# Patient Record
Sex: Female | Born: 1960
Health system: Southern US, Community
[De-identification: ages and names within clinical notes are randomized; demographics above are authoritative.]

## PROBLEM LIST (undated history)

## (undated) DIAGNOSIS — Z973 Presence of spectacles and contact lenses: Secondary | ICD-10-CM

## (undated) DIAGNOSIS — F419 Anxiety disorder, unspecified: Secondary | ICD-10-CM

## (undated) DIAGNOSIS — F329 Major depressive disorder, single episode, unspecified: Secondary | ICD-10-CM

## (undated) DIAGNOSIS — I2510 Atherosclerotic heart disease of native coronary artery without angina pectoris: Secondary | ICD-10-CM

## (undated) DIAGNOSIS — E785 Hyperlipidemia, unspecified: Secondary | ICD-10-CM

## (undated) DIAGNOSIS — N951 Menopausal and female climacteric states: Secondary | ICD-10-CM

## (undated) DIAGNOSIS — R51 Headache: Secondary | ICD-10-CM

## (undated) DIAGNOSIS — K219 Gastro-esophageal reflux disease without esophagitis: Secondary | ICD-10-CM

## (undated) DIAGNOSIS — R112 Nausea with vomiting, unspecified: Secondary | ICD-10-CM

## (undated) DIAGNOSIS — M1990 Unspecified osteoarthritis, unspecified site: Secondary | ICD-10-CM

## (undated) DIAGNOSIS — E538 Deficiency of other specified B group vitamins: Secondary | ICD-10-CM

## (undated) DIAGNOSIS — F32A Depression, unspecified: Secondary | ICD-10-CM

## (undated) DIAGNOSIS — R519 Headache, unspecified: Secondary | ICD-10-CM

## (undated) DIAGNOSIS — Z9889 Other specified postprocedural states: Secondary | ICD-10-CM

## (undated) HISTORY — DX: Hyperlipidemia, unspecified: E78.5

## (undated) HISTORY — PX: TONSILLECTOMY: SUR1361

## (undated) HISTORY — DX: Atherosclerotic heart disease of native coronary artery without angina pectoris: I25.10

## (undated) HISTORY — PX: DILATION AND CURETTAGE OF UTERUS: SHX78

## (undated) HISTORY — PX: ABDOMINAL HYSTERECTOMY: SHX81

## (undated) HISTORY — DX: Deficiency of other specified B group vitamins: E53.8

## (undated) HISTORY — DX: Gastro-esophageal reflux disease without esophagitis: K21.9

## (undated) HISTORY — PX: ANKLE RECONSTRUCTION: SHX1151

## (undated) HISTORY — DX: Menopausal and female climacteric states: N95.1

## (undated) SURGERY — LEFT HEART CATH
Anesthesia: Moderate Sedation

---

## 2003-08-21 ENCOUNTER — Encounter: Admission: RE | Admit: 2003-08-21 | Discharge: 2003-08-21 | Payer: Self-pay | Admitting: Orthopaedic Surgery

## 2006-04-25 ENCOUNTER — Ambulatory Visit: Payer: Self-pay | Admitting: Family Medicine

## 2007-07-02 ENCOUNTER — Telehealth: Payer: Self-pay | Admitting: Family Medicine

## 2007-07-03 ENCOUNTER — Ambulatory Visit: Payer: Self-pay | Admitting: Family Medicine

## 2007-07-03 DIAGNOSIS — L0291 Cutaneous abscess, unspecified: Secondary | ICD-10-CM | POA: Insufficient documentation

## 2007-07-03 DIAGNOSIS — F339 Major depressive disorder, recurrent, unspecified: Secondary | ICD-10-CM | POA: Insufficient documentation

## 2007-07-03 DIAGNOSIS — L0390 Cellulitis, unspecified: Secondary | ICD-10-CM

## 2007-07-03 DIAGNOSIS — F411 Generalized anxiety disorder: Secondary | ICD-10-CM | POA: Insufficient documentation

## 2007-07-06 ENCOUNTER — Telehealth: Payer: Self-pay | Admitting: Family Medicine

## 2007-07-06 ENCOUNTER — Encounter: Payer: Self-pay | Admitting: Family Medicine

## 2007-07-07 ENCOUNTER — Encounter: Payer: Self-pay | Admitting: Family Medicine

## 2007-07-16 ENCOUNTER — Encounter: Payer: Self-pay | Admitting: Family Medicine

## 2008-03-30 ENCOUNTER — Telehealth (INDEPENDENT_AMBULATORY_CARE_PROVIDER_SITE_OTHER): Payer: Self-pay | Admitting: *Deleted

## 2008-03-30 ENCOUNTER — Ambulatory Visit: Payer: Self-pay | Admitting: Family Medicine

## 2008-03-30 DIAGNOSIS — N390 Urinary tract infection, site not specified: Secondary | ICD-10-CM | POA: Insufficient documentation

## 2008-03-30 DIAGNOSIS — J1189 Influenza due to unidentified influenza virus with other manifestations: Secondary | ICD-10-CM | POA: Insufficient documentation

## 2008-03-30 LAB — CONVERTED CEMR LAB: Glucose, Urine, Semiquant: NEGATIVE

## 2008-03-31 ENCOUNTER — Encounter: Payer: Self-pay | Admitting: Family Medicine

## 2008-04-05 ENCOUNTER — Ambulatory Visit: Payer: Self-pay | Admitting: Family Medicine

## 2008-05-02 ENCOUNTER — Telehealth: Payer: Self-pay | Admitting: Family Medicine

## 2010-06-12 NOTE — Letter (Signed)
Summary: Out of Work  [employer] at [facility]  [address] [place], [place] [address]   Phone: [phone]  Fax: [phone]    July 03, 2007   Employee:  JILLAINE WAREN    To Whom It May Concern:   For Medical reasons, please excuse the above named employee from work for the following dates:  Start:   07/03/2007  End:   2/23009 if she is feeling better  If you need additional information, please feel free to contact our office.         Sincerely,    Judith Part MD

## 2011-06-11 ENCOUNTER — Ambulatory Visit: Payer: Self-pay

## 2012-03-27 ENCOUNTER — Other Ambulatory Visit: Payer: Self-pay | Admitting: Orthopedic Surgery

## 2012-03-27 DIAGNOSIS — M25561 Pain in right knee: Secondary | ICD-10-CM

## 2012-03-30 ENCOUNTER — Ambulatory Visit
Admission: RE | Admit: 2012-03-30 | Discharge: 2012-03-30 | Disposition: A | Discharge status: Home / Self Care | Payer: 59 | Source: Ambulatory Visit | Attending: Orthopedic Surgery | Admitting: Orthopedic Surgery

## 2012-03-30 DIAGNOSIS — M25561 Pain in right knee: Secondary | ICD-10-CM

## 2014-05-13 LAB — HM PAP SMEAR

## 2015-09-05 ENCOUNTER — Encounter: Payer: Self-pay | Admitting: *Deleted

## 2015-09-05 ENCOUNTER — Observation Stay
Admission: EM | Admit: 2015-09-05 | Discharge: 2015-09-06 | Disposition: A | Discharge status: Home / Self Care | Payer: 59 | Attending: Internal Medicine | Admitting: Internal Medicine

## 2015-09-05 ENCOUNTER — Emergency Department: Payer: 59

## 2015-09-05 DIAGNOSIS — E7800 Pure hypercholesterolemia, unspecified: Secondary | ICD-10-CM | POA: Insufficient documentation

## 2015-09-05 DIAGNOSIS — Z79899 Other long term (current) drug therapy: Secondary | ICD-10-CM | POA: Insufficient documentation

## 2015-09-05 DIAGNOSIS — R9439 Abnormal result of other cardiovascular function study: Secondary | ICD-10-CM | POA: Insufficient documentation

## 2015-09-05 DIAGNOSIS — I209 Angina pectoris, unspecified: Secondary | ICD-10-CM | POA: Diagnosis not present

## 2015-09-05 DIAGNOSIS — Z79890 Hormone replacement therapy (postmenopausal): Secondary | ICD-10-CM | POA: Insufficient documentation

## 2015-09-05 DIAGNOSIS — F419 Anxiety disorder, unspecified: Secondary | ICD-10-CM | POA: Insufficient documentation

## 2015-09-05 DIAGNOSIS — R079 Chest pain, unspecified: Secondary | ICD-10-CM

## 2015-09-05 DIAGNOSIS — Z880 Allergy status to penicillin: Secondary | ICD-10-CM | POA: Insufficient documentation

## 2015-09-05 DIAGNOSIS — I214 Non-ST elevation (NSTEMI) myocardial infarction: Secondary | ICD-10-CM | POA: Diagnosis present

## 2015-09-05 DIAGNOSIS — Z800 Family history of malignant neoplasm of digestive organs: Secondary | ICD-10-CM | POA: Insufficient documentation

## 2015-09-05 DIAGNOSIS — F329 Major depressive disorder, single episode, unspecified: Secondary | ICD-10-CM | POA: Insufficient documentation

## 2015-09-05 DIAGNOSIS — I200 Unstable angina: Secondary | ICD-10-CM | POA: Diagnosis present

## 2015-09-05 DIAGNOSIS — R0602 Shortness of breath: Secondary | ICD-10-CM | POA: Insufficient documentation

## 2015-09-05 DIAGNOSIS — R0789 Other chest pain: Secondary | ICD-10-CM | POA: Diagnosis not present

## 2015-09-05 DIAGNOSIS — Z8379 Family history of other diseases of the digestive system: Secondary | ICD-10-CM | POA: Diagnosis not present

## 2015-09-05 DIAGNOSIS — R42 Dizziness and giddiness: Secondary | ICD-10-CM | POA: Diagnosis not present

## 2015-09-05 HISTORY — DX: Anxiety disorder, unspecified: F41.9

## 2015-09-05 HISTORY — DX: Major depressive disorder, single episode, unspecified: F32.9

## 2015-09-05 HISTORY — DX: Depression, unspecified: F32.A

## 2015-09-05 LAB — BASIC METABOLIC PANEL
Anion gap: 8 (ref 5–15)
BUN: 12 mg/dL (ref 6–20)
CALCIUM: 9.5 mg/dL (ref 8.9–10.3)
CO2: 24 mmol/L (ref 22–32)
Chloride: 104 mmol/L (ref 101–111)
Creatinine, Ser: 1.1 mg/dL — ABNORMAL HIGH (ref 0.44–1.00)
GFR calc Af Amer: >60 mL/min (ref >60)
GFR, EST NON AFRICAN AMERICAN: 55 mL/min — AB (ref >60)
GLUCOSE: 109 mg/dL — AB (ref 65–99)
Potassium: 3.9 mmol/L (ref 3.5–5.1)
Sodium: 136 mmol/L (ref 135–145)

## 2015-09-05 LAB — CBC
HEMATOCRIT: 50.7 % — AB (ref 35.0–47.0)
Hemoglobin: 17 g/dL — ABNORMAL HIGH (ref 12.0–16.0)
MCH: 28.2 pg (ref 26.0–34.0)
MCHC: 33.5 g/dL (ref 32.0–36.0)
MCV: 84.3 fL (ref 80.0–100.0)
Platelets: 252 10*3/uL (ref 150–440)
RBC: 6.02 MIL/uL — ABNORMAL HIGH (ref 3.80–5.20)
RDW: 13.7 % (ref 11.5–14.5)
WBC: 8.9 10*3/uL (ref 3.6–11.0)

## 2015-09-05 LAB — TROPONIN I
TROPONIN I: 0.05 ng/mL — AB (ref <0.031)
TROPONIN I: 0.08 ng/mL — AB (ref <0.031)
Troponin I: 0.08 ng/mL — ABNORMAL HIGH (ref <0.031)
Troponin I: 0.07 ng/mL — ABNORMAL HIGH (ref <0.031)

## 2015-09-05 LAB — PROTIME-INR
INR: 0.94
Prothrombin Time: 12.8 seconds (ref 11.4–15.0)

## 2015-09-05 LAB — APTT: APTT: 30 s (ref 24–36)

## 2015-09-05 LAB — HEPARIN LEVEL (UNFRACTIONATED): Heparin Unfractionated: 0.29 IU/mL — ABNORMAL LOW (ref 0.30–0.70)

## 2015-09-05 MED ORDER — VITAMIN D 1000 UNITS PO TABS
1000.0000 [IU] | ORAL_TABLET | Freq: Every day | ORAL | Status: DC
  Administered 2015-09-06: 1000 [IU] via ORAL
  Filled 2015-09-05 (×2): qty 1

## 2015-09-05 MED ORDER — NITROGLYCERIN 0.4 MG SL SUBL: 0.4000 mg | SUBLINGUAL_TABLET | SUBLINGUAL | Status: DC | PRN

## 2015-09-05 MED ORDER — ADULT MULTIVITAMIN W/MINERALS CH
1.0000 | ORAL_TABLET | Freq: Every day | ORAL | Status: DC
  Administered 2015-09-05 – 2015-09-06 (×2): 1 via ORAL
  Filled 2015-09-05 (×2): qty 1

## 2015-09-05 MED ORDER — ACETAMINOPHEN 650 MG RE SUPP: 650.0000 mg | Freq: Four times a day (QID) | RECTAL | Status: DC | PRN

## 2015-09-05 MED ORDER — HEPARIN (PORCINE) IN NACL 100-0.45 UNIT/ML-% IJ SOLN
1000.0000 [IU]/h | INTRAMUSCULAR | Status: DC
  Administered 2015-09-05: 850 [IU]/h via INTRAVENOUS
  Administered 2015-09-06: 1000 [IU]/h via INTRAVENOUS
  Filled 2015-09-05 (×4): qty 250

## 2015-09-05 MED ORDER — BUPROPION HCL 75 MG PO TABS
75.0000 mg | ORAL_TABLET | Freq: Two times a day (BID) | ORAL | Status: DC
  Administered 2015-09-05 – 2015-09-06 (×2): 75 mg via ORAL
  Filled 2015-09-05 (×2): qty 1

## 2015-09-05 MED ORDER — MORPHINE SULFATE (PF) 2 MG/ML IV SOLN: 2.0000 mg | INTRAVENOUS | Status: DC | PRN

## 2015-09-05 MED ORDER — VITAMIN C 500 MG PO TABS
500.0000 mg | ORAL_TABLET | Freq: Every day | ORAL | Status: DC
  Administered 2015-09-05 – 2015-09-06 (×2): 500 mg via ORAL
  Filled 2015-09-05 (×2): qty 1

## 2015-09-05 MED ORDER — OXYCODONE HCL 5 MG PO TABS: 5.0000 mg | ORAL_TABLET | ORAL | Status: DC | PRN

## 2015-09-05 MED ORDER — HEPARIN BOLUS VIA INFUSION
4000.0000 [IU] | Freq: Once | INTRAVENOUS | Status: AC
  Administered 2015-09-05: 4000 [IU] via INTRAVENOUS
  Filled 2015-09-05: qty 4000

## 2015-09-05 MED ORDER — HEPARIN BOLUS VIA INFUSION
1100.0000 [IU] | Freq: Once | INTRAVENOUS | Status: AC
  Administered 2015-09-05: 1100 [IU] via INTRAVENOUS
  Filled 2015-09-05: qty 1100

## 2015-09-05 MED ORDER — ACETAMINOPHEN 325 MG PO TABS
650.0000 mg | ORAL_TABLET | Freq: Four times a day (QID) | ORAL | Status: DC | PRN
  Administered 2015-09-05: 650 mg via ORAL
  Filled 2015-09-05: qty 2

## 2015-09-05 MED ORDER — ASPIRIN EC 81 MG PO TBEC
81.0000 mg | DELAYED_RELEASE_TABLET | Freq: Every day | ORAL | Status: DC
  Administered 2015-09-06: 81 mg via ORAL
  Filled 2015-09-05: qty 1

## 2015-09-05 MED ORDER — SODIUM CHLORIDE 0.9 % IV SOLN
INTRAVENOUS | Status: DC
  Administered 2015-09-05 – 2015-09-06 (×2): via INTRAVENOUS

## 2015-09-05 MED ORDER — SODIUM CHLORIDE 0.9% FLUSH: 3.0000 mL | Freq: Two times a day (BID) | INTRAVENOUS | Status: DC

## 2015-09-05 MED ORDER — NITROGLYCERIN 0.4 MG/SPRAY TL SOLN: 1.0000 | Status: DC | PRN

## 2015-09-05 MED ORDER — ESTRADIOL 0.1 MG/24HR TD PTWK
0.1000 mg | MEDICATED_PATCH | TRANSDERMAL | Status: DC
  Administered 2015-09-05: 0.1 mg via TRANSDERMAL
  Filled 2015-09-05: qty 1

## 2015-09-05 MED ORDER — PREDNISONE 2.5 MG PO TABS
15.0000 mg | ORAL_TABLET | Freq: Once | ORAL | Status: AC
  Administered 2015-09-05: 15 mg via ORAL
  Filled 2015-09-05: qty 1

## 2015-09-05 MED ORDER — NITROGLYCERIN 0.4 MG SL SUBL
0.4000 mg | SUBLINGUAL_TABLET | Freq: Once | SUBLINGUAL | Status: AC
  Administered 2015-09-05: 0.4 mg via SUBLINGUAL
  Filled 2015-09-05: qty 3

## 2015-09-05 MED ORDER — ASPIRIN 81 MG PO CHEW
324.0000 mg | CHEWABLE_TABLET | Freq: Once | ORAL | Status: AC
  Administered 2015-09-05: 324 mg via ORAL
  Filled 2015-09-05: qty 4

## 2015-09-05 NOTE — Progress Notes (Signed)
Patient admited to unit from ED for chest pain and SOB with excertion. Patient presently resting in the bed, alert and oriented. Denies pain at time. Heparin drip infusing at 8.5 unit per hr, vss, family at bedside.

## 2015-09-05 NOTE — H&P (Signed)
[facility] [place] [facility]   PATIENT NAME: Kathy Avila    MR#:  [id number]  DATE OF BIRTH:  1960-08-14  DATE OF ADMISSION:  09/05/2015  PRIMARY CARE PHYSICIAN: Lenice Llamas, NP   REQUESTING/REFERRING PHYSICIAN: Dr. Jene Every  CHIEF COMPLAINT:   Chief Complaint  Patient presents with  . Chest Pain    HISTORY OF PRESENT ILLNESS:  Kathy Avila  is a 55 y.o. female presents after chest pain. This morning she walked a mile. She usually walks a mile. 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 Then she developed chest pain in the center of her chest 10 out of 10 in intensity. It eased up to about 5 out of 6 out of 10 intensity. Pain does not radiate. Associated with shortness of breath, nausea and total body weakness. A couple weeks ago she also had similar things where she was lightheaded and seeing spots but did not develop chest pain at that time. In the ER, she was found to have a borderline troponin of 0.05 and hospitalist services were contacted for further evaluation.  PAST MEDICAL HISTORY:   Past Medical History  Diagnosis Date  . Anxiety   . Depression     PAST SURGICAL HISTORY:   Past Surgical History  Procedure Laterality Date  . Ankle reconstruction Bilateral     SOCIAL HISTORY:   Social History  Substance Use Topics  . Smoking status: Never Smoker   . Smokeless tobacco: Not on file  . Alcohol Use: 0.6 oz/week    1 Glasses of wine per week    FAMILY HISTORY:   Family History  Problem Relation Age of Onset  . Cirrhosis Mother   . Throat cancer Father     DRUG ALLERGIES:  No Known Allergies  REVIEW OF SYSTEMS:  CONSTITUTIONAL: No fever, positive for weakness positive for chills and sweats.  EYES: Some spots bilateral eyes, wears contacts EARS, NOSE, AND THROAT: No tinnitus or ear pain. No sore throat RESPIRATORY: No cough, positive for  shortness of breath, no wheezing or hemoptysis.  CARDIOVASCULAR: Positive for chest pain, no orthopnea, edema.  GASTROINTESTINAL: Positive for nausea.  No vomiting, diarrhea. Some lower abdominal pain this morning. No blood in bowel movements. GENITOURINARY: No dysuria, hematuria.  ENDOCRINE: No polyuria, nocturia,  HEMATOLOGY: No anemia, easy bruising or bleeding SKIN: No rash or lesion. MUSCULOSKELETAL: Some pain in knees.   NEUROLOGIC: No tingling, numbness, weakness.  PSYCHIATRY: History of anxiety and depression.   MEDICATIONS AT HOME:   Prior to Admission medications   Medication Sig Start Date End Date Taking? Authorizing Provider  buPROPion (WELLBUTRIN) 75 MG tablet Take 75 mg by mouth 2 (two) times daily.   Yes Historical Provider, MD  cholecalciferol (VITAMIN D) 1000 units tablet Take 1,000 Units by mouth daily.   Yes Historical Provider, MD  cyanocobalamin (,VITAMIN B-12,) 1000 MCG/ML injection Inject 1,000 mcg into the muscle once a week.   Yes Historical Provider, MD  estradiol (VIVELLE-DOT) 0.075 MG/24HR Place 1 patch onto the skin once a week.   Yes Historical Provider, MD  Multiple Vitamin (MULTIVITAMIN WITH MINERALS) TABS tablet Take 1 tablet by mouth daily.   Yes Historical Provider, MD  phytonadione (VITAMIN K) 5 MG tablet Take 5 mg by mouth daily.   Yes Historical Provider, MD  vitamin C (ASCORBIC ACID) 500 MG tablet Take 500 mg by mouth daily.   Yes Historical Provider,  MD      VITAL SIGNS:  Blood pressure 133/87, pulse 86, temperature 98.2 F (36.8 C), temperature source Oral, resp. rate 18, height 5\' 6"  (1.676 m), weight 72.576 kg (160 lb), SpO2 99 %.  PHYSICAL EXAMINATION:  GENERAL:  55 y.o.-year-old patient lying in the bed with no acute distress.  EYES: Pupils equal, round, reactive to light and accommodation. No scleral icterus. Extraocular muscles intact.  HEENT: Head atraumatic, normocephalic. Oropharynx and nasopharynx clear.  NECK:  Supple, no jugular  venous distention. No thyroid enlargement, no tenderness.  LUNGS: Normal breath sounds bilaterally, no wheezing, rales,rhonchi or crepitation. No use of accessory muscles of respiration.  CARDIOVASCULAR: S1, S2 normal. No murmurs, rubs, or gallops. Chest wall pain reproducible to palpation left parasternal area ABDOMEN: Soft, nontender, nondistended. Bowel sounds present. No organomegaly or mass.  EXTREMITIES: No pedal edema, cyanosis, or clubbing.  NEUROLOGIC: Cranial nerves II through XII are intact. Muscle strength 5/5 in all extremities. Sensation intact. Gait not checked.  PSYCHIATRIC: The patient is alert and oriented x 3.  SKIN: No rash, lesion, or ulcer.   LABORATORY PANEL:   CBC  Recent Labs Lab 09/05/15 0905  WBC 8.9  HGB 17.0*  HCT 50.7*  PLT 252   ------------------------------------------------------------------------------------------------------------------  Chemistries   Recent Labs Lab 09/05/15 0905  NA 136  K 3.9  CL 104  CO2 24  GLUCOSE 109*  BUN 12  CREATININE 1.10*  CALCIUM 9.5   ------------------------------------------------------------------------------------------------------------------  Cardiac Enzymes  Recent Labs Lab 09/05/15 0905  TROPONINI 0.05*   ------------------------------------------------------------------------------------------------------------------  RADIOLOGY:  Dg Chest 2 View  09/05/2015  CLINICAL DATA:  Left chest pain and shortness of breath. EXAM: CHEST  2 VIEW COMPARISON:  None. FINDINGS: The heart size and mediastinal contours are within normal limits. There is no focal infiltrate, pulmonary edema, or pleural effusion. Degenerative joint changes of the spine are identified. IMPRESSION: No active cardiopulmonary disease. Electronically Signed   By: Sherian ReinWei-Chen  Lin M.D.   On: 09/05/2015 09:42    EKG:   Sinus rhythm 93 bpm poor R-wave progression  IMPRESSION AND PLAN:   1. Chest pain at rest and borderline  troponin, concerning for possible unstable angina. Patient does have a reproducible chest pain component. Aspirin given and heparin drip ordered. ER physician spoke with cardiologist Dr. Gwen PoundsKowalski. Serial troponins and monitor on telemetry. Since the patient does have a reproducible component oh give 1 dose of prednisone. Nitroglycerin as needed. 2. Anxiety depression continue Wellbutrin 3. Continue hormone replacement for right now 4. Elevated hemoglobin. No priors to look at. I will hydrate overnight and recheck tomorrow.   All the records are reviewed and case discussed with ED provider. Management plans discussed with the patient, family and they are in agreement.  CODE STATUS: Full code  TOTAL TIME TAKING CARE OF THIS PATIENT: 50 minutes.    Alford HighlandWIETING, Linsey Arteaga M.D on 09/05/2015 at 11:18 AM  Between 7am to 6pm - Pager - [phone]  After 6pm call admission pager [phone]  [facility]  [phone]  CC: Primary care physician; Lenice LlamasBROWN, LESLIE A, NP

## 2015-09-05 NOTE — Consult Note (Signed)
[facility] Cardiology Consultation Note  Patient ID: Kathy Avila, MRN: [id number], DOB/AGE: 57/21/1962 57 y.o. Admit date: 09/05/2015   Date of Consult: 09/05/2015 Primary Physician: Lenice Llamas, NP Primary Cardiologist: None  Chief Complaint:  Chief Complaint  Patient presents with  . Chest Pain   Reason for Consult: chest pain  HPI: 55 y.o. female with no history of cardiovascular disease and no family history of cardiovascular disease with no evidence of history of hypertension hyperlipidemia diabetes or other cardiovascular issues having had new onset substernal chest discomfort radiating into her back significant to a concerns causing dizziness weakness as well as shortness of breath for several hours after resting. The patient has done a physical activity regimen of walking recently and has had no symptoms whatsoever. With this chest discomfort she was seen in the emergency room with an EKG showing normal sinus rhythm and a troponin of 0.08. The patient has not had any other changes and he she has had a full relief of her chest pain at this time.  Past Medical History  Diagnosis Date  . Anxiety   . Depression       Surgical History:  Past Surgical History  Procedure Laterality Date  . Ankle reconstruction Bilateral      Home Meds: Prior to Admission medications   Medication Sig Start Date End Date Taking? Authorizing Provider  buPROPion (WELLBUTRIN) 75 MG tablet Take 75 mg by mouth 2 (two) times daily.   Yes Historical Provider, MD  cholecalciferol (VITAMIN D) 1000 units tablet Take 1,000 Units by mouth daily.   Yes Historical Provider, MD  cyanocobalamin (,VITAMIN B-12,) 1000 MCG/ML injection Inject 1,000 mcg into the muscle once a week.   Yes Historical Provider, MD  estradiol (VIVELLE-DOT) 0.075 MG/24HR Place 1 patch onto the skin once a week.   Yes Historical Provider, MD  Multiple Vitamin (MULTIVITAMIN WITH MINERALS) TABS tablet Take 1 tablet by mouth  daily.   Yes Historical Provider, MD  phytonadione (VITAMIN K) 5 MG tablet Take 5 mg by mouth daily.   Yes Historical Provider, MD  vitamin C (ASCORBIC ACID) 500 MG tablet Take 500 mg by mouth daily.   Yes Historical Provider, MD    Inpatient Medications:  . [START ON 09/06/2015] aspirin EC  81 mg Oral Daily  . buPROPion  75 mg Oral BID  . [START ON 09/06/2015] cholecalciferol  1,000 Units Oral Daily  . estradiol  0.1 mg Transdermal Weekly  . multivitamin with minerals  1 tablet Oral Daily  . sodium chloride flush  3 mL Intravenous Q12H  . vitamin C  500 mg Oral Daily   . sodium chloride 50 mL/hr at 09/05/15 1429  . heparin 850 Units/hr (09/05/15 1054)    Allergies: No Known Allergies  Social History   Social History  . Marital Status: Divorced    Spouse Name: N/A  . Number of Children: N/A  . Years of Education: N/A   Occupational History  . Not on file.   Social History Main Topics  . Smoking status: Never Smoker   . Smokeless tobacco: Not on file  . Alcohol Use: 0.6 oz/week    1 Glasses of wine per week  . Drug Use: No  . Sexual Activity: Not on file   Other Topics Concern  . Not on file   Social History Narrative  . No narrative on file     Family History  Problem Relation Age of Onset  . Cirrhosis Mother   .  Throat cancer Father      Review of Systems Positive for Chest pain dizziness Negative for: General:  chills, fever, night sweats or weight changes.  Cardiovascular: PND orthopnea syncope positive for dizziness  Dermatological skin lesions rashes Respiratory: Cough congestion Urologic: Frequent urination urination at night and hematuria Abdominal: negative for nausea, vomiting, diarrhea, bright red blood per rectum, melena, or hematemesis Neurologic: negative for visual changes, and/or hearing changes  All other systems reviewed and are otherwise negative except as noted above.  Labs:  Recent Labs  09/05/15 0905 09/05/15 1324  TROPONINI  0.05* 0.08*   Lab Results  Component Value Date   WBC 8.9 09/05/2015   HGB 17.0* 09/05/2015   HCT 50.7* 09/05/2015   MCV 84.3 09/05/2015   PLT 252 09/05/2015    Recent Labs Lab 09/05/15 0905  NA 136  K 3.9  CL 104  CO2 24  BUN 12  CREATININE 1.10*  CALCIUM 9.5  GLUCOSE 109*   No results found for: CHOL, HDL, LDLCALC, TRIG No results found for: DDIMER  Radiology/Studies:  Dg Chest 2 View  09/05/2015  CLINICAL DATA:  Left chest pain and shortness of breath. EXAM: CHEST  2 VIEW COMPARISON:  None. FINDINGS: The heart size and mediastinal contours are within normal limits. There is no focal infiltrate, pulmonary edema, or pleural effusion. Degenerative joint changes of the spine are identified. IMPRESSION: No active cardiopulmonary disease. Electronically Signed   By: Sherian ReinWei-Chen  Lin M.D.   On: 09/05/2015 09:42    EKG: Normal sinus rhythm  Weights: Filed Weights   09/05/15 0902  Weight: 160 lb (72.576 kg)     Physical Exam: Blood pressure 132/83, pulse 75, temperature 98 F (36.7 C), temperature source Oral, resp. rate 18, height 5\' 6"  (1.676 m), weight 160 lb (72.576 kg), SpO2 100 %. Body mass index is 25.84 kg/(m^2). General: Well developed, well nourished, in no acute distress. Head eyes ears nose throat: Normocephalic, atraumatic, sclera non-icteric, no xanthomas, nares are without discharge. No apparent thyromegaly and/or mass  Lungs: Normal respiratory effort.  no wheezes, no rales, no rhonchi.  Heart: RRR with normal S1 S2. no murmur gallop, no rub, PMI is normal size and placement, carotid upstroke normal without bruit, jugular venous pressure is normal Abdomen: Soft, non-tender, non-distended with normoactive bowel sounds. No hepatomegaly. No rebound/guarding. No obvious abdominal masses. Abdominal aorta is normal size without bruit Extremities: No edema. no cyanosis, no clubbing, no ulcers  Peripheral : 2+ bilateral upper extremity pulses, 2+ bilateral femoral  pulses, 2+ bilateral dorsal pedal pulse Neuro: Alert and oriented. No facial asymmetry. No focal deficit. Moves all extremities spontaneously. Musculoskeletal: Normal muscle tone without kyphosis Psych:  Responds to questions appropriately with a normal affect.    Assessment 55 year old female with no evidence of cardiovascular risk factors having new onset of substernal chest discomfort with normal EKG and minimal elevation of troponin and atypical in nature without current evidence of myocardial infarction consistent with [place] class for unstable angina  Plan: 1. Heparin for further risk reduction of possible myocardial infarction 2. Serial ECG and enzymes to assess for possible myocardial infarction 3. Further consideration of cardiac catheterization versus ambulation with discharge versus stress test depending on EKG and troponin levels and clinical assessment of chest pain overnight  Signed, Lamar BlinksKOWALSKI,BRUCE J M.D. [facility] 09/05/2015, 5:19 PM

## 2015-09-05 NOTE — ED Notes (Signed)
Admit Md at bedside

## 2015-09-05 NOTE — ED Provider Notes (Signed)
[facility] Emergency Department Provider Note  ____________________________________________    I have reviewed the triage vital signs and the nursing notes.   HISTORY  Chief Complaint Chest Pain    HPI Kathy Avila is a 55 y.o. female who presents with chest pain and dizziness. Patient reports she was at work sitting at her desk when she developed pressure-like moderate central chest pain and became very dizzy and lightheaded. She has never had this before. She denies recent travel or calf pain or swelling. She does not smoke. No history of heart disease. She reports she is feeling better now but continues to have mild tightness in her chest     History reviewed. No pertinent past medical history.  Patient Active Problem List   Diagnosis Date Noted  . INFLUENZA WITH OTHER MANIFESTATIONS 03/30/2008  . UTI 03/30/2008  . ANXIETY 07/03/2007  . DEPRESSION 07/03/2007  . ABSCESS, SKIN 07/03/2007    No past surgical history on file.  No current outpatient prescriptions on file.  Allergies Penicillins  History reviewed. No pertinent family history.  Social History Social History  Substance Use Topics  . Smoking status: None  . Smokeless tobacco: None  . Alcohol Use: None    Review of Systems  Constitutional: Positive for dizziness Eyes: Negative for redness ENT: Negative for sore throat Cardiovascular: As above Respiratory: Negative for shortness of breath. Gastrointestinal: Positive for nausea Genitourinary: Negative for dysuria. Musculoskeletal: Negative for back pain. Skin: Negative for rash. Neurological: Negative for focal weakness Psychiatric: Mild anxiety    ____________________________________________   PHYSICAL EXAM:  VITAL SIGNS: ED Triage Vitals  Enc Vitals Group     BP 09/05/15 0902 138/76 mmHg     Pulse Rate 09/05/15 0902 102     Resp 09/05/15 0902 18     Temp 09/05/15 0902 98.2 F (36.8 C)     Temp  Source 09/05/15 0902 Oral     SpO2 09/05/15 0902 100 %     Weight 09/05/15 0902 160 lb (72.576 kg)     Height 09/05/15 0902 5\' 6"  (1.676 m)     Head Cir --      Peak Flow --      Pain Score 09/05/15 0903 5     Pain Loc --      Pain Edu? --      Excl. in GC? --      Constitutional: Alert and oriented. No acute distress Eyes: Conjunctivae are normal. No erythema or injection ENT   Head: Normocephalic and atraumatic.   Mouth/Throat: Mucous membranes are moist. Cardiovascular: Normal rate, regular rhythm. Normal and symmetric distal pulses are present in the upper extremities. No murmurs or rubs  Respiratory: Normal respiratory effort without tachypnea nor retractions. Breath sounds are clear and equal bilaterally.  Gastrointestinal: Soft and non-tender in all quadrants. No distention. There is no CVA tenderness. Genitourinary: deferred Musculoskeletal: Nontender with normal range of motion in all extremities. No lower extremity tenderness nor edema. Neurologic:  Normal speech and language. No gross focal neurologic deficits are appreciated. Skin:  Skin is warm, dry and intact. No rash noted. Psychiatric: Mood and affect are normal. Patient exhibits appropriate insight and judgment.  ____________________________________________    LABS (pertinent positives/negatives)  Labs Reviewed  BASIC METABOLIC PANEL - Abnormal; Notable for the following:    Glucose, Bld 109 (*)    Creatinine, Ser 1.10 (*)    GFR calc non Af Amer 55 (*)    All other components  within normal limits  CBC - Abnormal; Notable for the following:    RBC 6.02 (*)    Hemoglobin 17.0 (*)    HCT 50.7 (*)    All other components within normal limits  TROPONIN I - Abnormal; Notable for the following:    Troponin I 0.05 (*)    All other components within normal limits  APTT    ____________________________________________   EKG  #1 ED ECG REPORT I, Jene Every, the attending physician, personally  viewed and interpreted this ECG.  Date: 09/05/2015 EKG Time: 8:59 AM Rate: 95 Rhythm: normal sinus rhythm QRS Axis: normal Intervals: normal ST/T Wave abnormalities: Nonspecific T-wave change in V2 Conduction Disturbances: none I asked for a repeat based on V2 changes  #2 ED ECG REPORT I, Jene Every, the attending physician, personally viewed and interpreted this ECG.  Date: 09/05/2015 EKG Time: 9:15 AM Rate: 93 Rhythm: normal sinus rhythm QRS Axis: normal Intervals: normal ST/T Wave abnormalities: normal Conduction Disturbances: none Narrative Interpretation: unremarkable    ____________________________________________    RADIOLOGY  Chest x-ray unremarkable  ____________________________________________   PROCEDURES  Procedure(s) performed: none  Critical Care performed: yes CRITICAL CARE Performed by: Jene Every   Total critical care time: 30 minutes  Critical care time was exclusive of separately billable procedures and treating other patients.  Critical care was necessary to treat or prevent imminent or life-threatening deterioration.  Critical care was time spent personally by me on the following activities: development of treatment plan with patient and/or surrogate as well as nursing, discussions with consultants, evaluation of patient's response to treatment, examination of patient, obtaining history from patient or surrogate, ordering and performing treatments and interventions, ordering and review of laboratory studies, ordering and review of radiographic studies, pulse oximetry and re-evaluation of patient's condition.   ____________________________________________   INITIAL IMPRESSION / ASSESSMENT AND PLAN / ED COURSE  Pertinent labs & imaging results that were available during my care of the patient were reviewed by me and considered in my medical decision making (see chart for details).  Patient presents after episode of chest pain  with dizziness and nausea. Nonspecific change in V2 an initial EKG which resolved on second EKG. Patient is to have mild chest tightness. We will give aspirin, nitroglycerin.  Mildly Elevated troponin is concerning for ACS given patient's story. Given continued mild chest pain history of present illness consistent with ACS and elevated troponin I will start her on heparin drip.  Discussed the case with Dr. Gwen Pounds who agrees with management and will see the patient in the hospital  ____________________________________________   FINAL CLINICAL IMPRESSION(S) / ED DIAGNOSES  Final diagnoses:  NSTEMI (non-ST elevated myocardial infarction) (HCC)          Jene Every, MD 09/05/15 1023

## 2015-09-05 NOTE — Progress Notes (Signed)
ANTICOAGULATION CONSULT NOTE - Initial Consult  Pharmacy Consult for Heparin Indication: chest pain/ACS  No Known Allergies  Patient Measurements: Height: 5\' 6"  (167.6 cm) Weight: 160 lb (72.576 kg) IBW/kg (Calculated) : 59.3 Heparin Dosing Weight: 72.6 kg  Vital Signs: Temp: 97.9 F (36.6 C) (04/25 1918) Temp Source: Oral (04/25 1918) BP: 122/68 mmHg (04/25 1918) Pulse Rate: 88 (04/25 1918)  Labs:  Recent Labs  09/05/15 0905 09/05/15 0955 09/05/15 1324 09/05/15 1700  HGB 17.0*  --   --   --   HCT 50.7*  --   --   --   PLT 252  --   --   --   APTT  --  30  --   --   LABPROT  --  12.8  --   --   INR  --  0.94  --   --   HEPARINUNFRC  --   --   --  0.29*  CREATININE 1.10*  --   --   --   TROPONINI 0.05*  --  0.08* 0.08*    Estimated Creatinine Clearance: 57.5 mL/min (by C-G formula based on Cr of 1.1).    Assessment: 55 yo female here with chest pain, r/o ACS. No PTA meds entered yet, no anticoagulants noted in Care Everywhere, no anticoagulants per RN.  Hgb 17.0, Plt 252 INR and aPTT pending  Goal of Therapy:  Heparin level 0.3-0.7 units/ml Monitor platelets by anticoagulation protocol: Yes   Plan:  Heparin level is below goal. Will bolus heparin 1100 units and increase infusion to 1000 units/hr. Will recheck a HL in 6 hours.  Luisa HartChristy, Jamilla Galli D 09/05/2015,7:52 PM

## 2015-09-05 NOTE — ED Notes (Signed)
States at 0730 this AM she was sitting at her desk and had a sudden onset of dizziness and seeing spots with chest tightness, at present pt returns to baseline, awake and alert, states some SOB with episode

## 2015-09-05 NOTE — ED Notes (Signed)
Report given to Madelaine BhatAdam and Jeannett SeniorStephen

## 2015-09-05 NOTE — Progress Notes (Signed)
ANTICOAGULATION CONSULT NOTE - Initial Consult  Pharmacy Consult for Heparin Indication: chest pain/ACS  Allergies  Allergen Reactions  . Penicillins     Patient Measurements: Height: 5\' 6"  (167.6 cm) Weight: 160 lb (72.576 kg) IBW/kg (Calculated) : 59.3 Heparin Dosing Weight: 72.6 kg  Vital Signs: Temp: 98.2 F (36.8 C) (04/25 0902) Temp Source: Oral (04/25 0902) BP: 138/76 mmHg (04/25 0902) Pulse Rate: 102 (04/25 0902)  Labs:  Recent Labs  09/05/15 0905  HGB 17.0*  HCT 50.7*  PLT 252  CREATININE 1.10*  TROPONINI 0.05*    Estimated Creatinine Clearance: 57.5 mL/min (by C-G formula based on Cr of 1.1).    Assessment: 55 yo female here with chest pain, r/o ACS. No PTA meds entered yet, no anticoagulants noted in Care Everywhere, no anticoagulants per RN.  Hgb 17.0, Plt 252 INR and aPTT pending  Goal of Therapy:  Heparin level 0.3-0.7 units/ml Monitor platelets by anticoagulation protocol: Yes   Plan:  Give 4000 units bolus x 1 Start heparin infusion at 850 units/hr Check anti-Xa level in 6 hours and daily while on heparin Continue to monitor H&H and platelets - CBC in AM  Crist FatWang, Javarian Jakubiak L 09/05/2015,10:08 AM

## 2015-09-06 ENCOUNTER — Observation Stay: Payer: 59

## 2015-09-06 ENCOUNTER — Observation Stay
Admit: 2015-09-06 | Discharge: 2015-09-06 | Disposition: A | Discharge status: Home / Self Care | Payer: 59 | Attending: Internal Medicine | Admitting: Internal Medicine

## 2015-09-06 ENCOUNTER — Encounter
Admission: EM | Disposition: A | Discharge status: Home / Self Care | Payer: Self-pay | Source: Home / Self Care | Attending: Emergency Medicine

## 2015-09-06 HISTORY — PX: CARDIAC CATHETERIZATION: SHX172

## 2015-09-06 LAB — CBC
HCT: 46.2 % (ref 35.0–47.0)
Hemoglobin: 15.3 g/dL (ref 12.0–16.0)
MCH: 28 pg (ref 26.0–34.0)
MCHC: 33.2 g/dL (ref 32.0–36.0)
MCV: 84.5 fL (ref 80.0–100.0)
PLATELETS: 223 10*3/uL (ref 150–440)
RBC: 5.46 MIL/uL — AB (ref 3.80–5.20)
RDW: 13.5 % (ref 11.5–14.5)
WBC: 11.6 10*3/uL — AB (ref 3.6–11.0)

## 2015-09-06 LAB — HEPARIN LEVEL (UNFRACTIONATED)
HEPARIN UNFRACTIONATED: 0.68 [IU]/mL (ref 0.30–0.70)
Heparin Unfractionated: 0.7 IU/mL (ref 0.30–0.70)

## 2015-09-06 LAB — BASIC METABOLIC PANEL
ANION GAP: 5 (ref 5–15)
BUN: 13 mg/dL (ref 6–20)
CALCIUM: 8.7 mg/dL — AB (ref 8.9–10.3)
CO2: 25 mmol/L (ref 22–32)
Chloride: 108 mmol/L (ref 101–111)
Creatinine, Ser: 0.91 mg/dL (ref 0.44–1.00)
GFR calc non Af Amer: >60 mL/min (ref >60)
Glucose, Bld: 110 mg/dL — ABNORMAL HIGH (ref 65–99)
POTASSIUM: 4 mmol/L (ref 3.5–5.1)
SODIUM: 138 mmol/L (ref 135–145)

## 2015-09-06 LAB — LIPID PANEL
CHOL/HDL RATIO: 2.3 ratio
Cholesterol: 143 mg/dL (ref 0–200)
HDL: 62 mg/dL (ref >40)
LDL CALC: 64 mg/dL (ref 0–99)
TRIGLYCERIDES: 87 mg/dL (ref <150)
VLDL: 17 mg/dL (ref 0–40)

## 2015-09-06 SURGERY — LEFT HEART CATH AND CORONARY ANGIOGRAPHY
Anesthesia: Moderate Sedation

## 2015-09-06 MED ORDER — ACETAMINOPHEN 325 MG PO TABS: 650.0000 mg | ORAL_TABLET | ORAL | Status: DC | PRN

## 2015-09-06 MED ORDER — FENTANYL CITRATE (PF) 100 MCG/2ML IJ SOLN
INTRAMUSCULAR | Status: DC | PRN
  Administered 2015-09-06: 25 ug via INTRAVENOUS

## 2015-09-06 MED ORDER — SODIUM CHLORIDE 0.9 % IV SOLN: INTRAVENOUS | Status: DC

## 2015-09-06 MED ORDER — PANTOPRAZOLE SODIUM 40 MG PO TBEC: 40.0000 mg | DELAYED_RELEASE_TABLET | Freq: Every day | ORAL | Status: DC

## 2015-09-06 MED ORDER — HEPARIN (PORCINE) IN NACL 2-0.9 UNIT/ML-% IJ SOLN
INTRAMUSCULAR | Status: AC
  Filled 2015-09-06: qty 500

## 2015-09-06 MED ORDER — IOPAMIDOL (ISOVUE-370) INJECTION 76%
75.0000 mL | Freq: Once | INTRAVENOUS | Status: AC | PRN
  Administered 2015-09-06: 75 mL via INTRAVENOUS

## 2015-09-06 MED ORDER — SODIUM CHLORIDE 0.9% FLUSH: 3.0000 mL | INTRAVENOUS | Status: DC | PRN

## 2015-09-06 MED ORDER — SODIUM CHLORIDE 0.9% FLUSH: 3.0000 mL | Freq: Two times a day (BID) | INTRAVENOUS | Status: DC

## 2015-09-06 MED ORDER — ACETAMINOPHEN 650 MG RE SUPP: 650.0000 mg | Freq: Four times a day (QID) | RECTAL | Status: DC | PRN

## 2015-09-06 MED ORDER — SODIUM CHLORIDE 0.9 % WEIGHT BASED INFUSION: 3.0000 mL/kg/h | INTRAVENOUS | Status: DC

## 2015-09-06 MED ORDER — FENTANYL CITRATE (PF) 100 MCG/2ML IJ SOLN
INTRAMUSCULAR | Status: AC
  Filled 2015-09-06: qty 2

## 2015-09-06 MED ORDER — MIDAZOLAM HCL 2 MG/2ML IJ SOLN
INTRAMUSCULAR | Status: AC
  Filled 2015-09-06: qty 2

## 2015-09-06 MED ORDER — SODIUM CHLORIDE 0.9 % IV SOLN: 250.0000 mL | INTRAVENOUS | Status: DC | PRN

## 2015-09-06 MED ORDER — SODIUM CHLORIDE 0.9 % WEIGHT BASED INFUSION: 1.0000 mL/kg/h | INTRAVENOUS | Status: DC

## 2015-09-06 MED ORDER — IBUPROFEN 200 MG PO TABS
200.0000 mg | ORAL_TABLET | Freq: Four times a day (QID) | ORAL | Status: DC | PRN
Start: 2015-09-06 — End: 2017-12-26

## 2015-09-06 MED ORDER — MIDAZOLAM HCL 2 MG/2ML IJ SOLN
INTRAMUSCULAR | Status: DC | PRN
  Administered 2015-09-06 (×2): 1 mg via INTRAVENOUS

## 2015-09-06 MED ORDER — ACETAMINOPHEN 325 MG PO TABS: 650.0000 mg | ORAL_TABLET | Freq: Four times a day (QID) | ORAL | Status: DC | PRN

## 2015-09-06 MED ORDER — ONDANSETRON HCL 4 MG/2ML IJ SOLN: 4.0000 mg | Freq: Four times a day (QID) | INTRAMUSCULAR | Status: DC | PRN

## 2015-09-06 MED ORDER — PANTOPRAZOLE SODIUM 40 MG PO TBEC
40.0000 mg | DELAYED_RELEASE_TABLET | Freq: Every day | ORAL | Status: DC
  Filled 2015-09-06 (×2): qty 1

## 2015-09-06 MED ORDER — ASPIRIN 81 MG PO CHEW: 81.0000 mg | CHEWABLE_TABLET | ORAL | Status: DC

## 2015-09-06 SURGICAL SUPPLY — 9 items
CATH INFINITI 5FR ANG PIGTAIL (CATHETERS) ×2 IMPLANT
CATH INFINITI 5FR JL4 (CATHETERS) ×2 IMPLANT
CATH INFINITI JR4 5F (CATHETERS) ×2 IMPLANT
DEVICE CLOSURE MYNXGRIP 5F (Vascular Products) ×2 IMPLANT
KIT MANI 3VAL PERCEP (MISCELLANEOUS) ×2 IMPLANT
NEEDLE PERC 18GX7CM (NEEDLE) ×2 IMPLANT
PACK CARDIAC CATH (CUSTOM PROCEDURE TRAY) ×2 IMPLANT
SHEATH PINNACLE 5F 10CM (SHEATH) ×2 IMPLANT
WIRE EMERALD 3MM-J .035X150CM (WIRE) ×2 IMPLANT

## 2015-09-06 NOTE — Progress Notes (Signed)
[name]  Patient: Kathy Avila / Admit Date: 09/05/2015 / Date of Encounter: 09/06/2015, 12:39 PM   Subjecti Patient had continued chest discomfort with changes in position in the bed with a burning concern Patient attempted treadmill EKG with no EKG changes but progressive symptoms of unstable angina RDI catheterization performed showing normal LV systolic function and normal coronary arteries with ejection fraction of 60%  Review of Systems: Positive for: Chest pain Negative for: Vision change, hearing change, syncope, dizziness, nausea, vomiting,diarrhea, bloody stool, stomach pain, cough, congestion, diaphoresis, urinary frequency, urinary pain,skin lesions, skin rashes Others previously listed  Objective: Telemetry: Normal sinus rhythm Physical Exam: Blood pressure 120/69, pulse 81, temperature 98.1 F (36.7 C), temperature source Oral, resp. rate 14, height 5\' 6"  (1.676 m), weight 160 lb (72.576 kg), SpO2 100 %. Body mass index is 25.84 kg/(m^2). General: Well developed, well nourished, in no acute distress. Head: Normocephalic, atraumatic, sclera non-icteric, no xanthomas, nares are without discharge. Neck: No apparent masses Lungs: Normal respirations with no wheezes, no rhonchi, no rales , no crackles   Heart: Regular rate and rhythm, normal S1 S2, no murmur, no rub, no gallop, PMI is normal size and placement, carotid upstroke normal without bruit, jugular venous pressure normal Abdomen: Soft, non-tender, non-distended with normoactive bowel sounds. No hepatosplenomegaly. Abdominal aorta is normal size without bruit Extremities: No edema, no clubbing, no cyanosis, no ulcers,  Peripheral: 2+ radial, 2+ femoral, 2+ dorsal pedal pulses Neuro: Alert and oriented. Moves all extremities spontaneously. Psych:  Responds to questions appropriately with a normal affect.   Intake/Output Summary (Last 24 hours) at 09/06/15 1239 Last data  filed at 09/06/15 0830  Gross per 24 hour  Intake 312.23 ml  Output   1900 ml  Net -1587.77 ml    Inpatient Medications:  . [MAR Hold] aspirin EC  81 mg Oral Daily  . [MAR Hold] buPROPion  75 mg Oral BID  . [MAR Hold] cholecalciferol  1,000 Units Oral Daily  . [MAR Hold] estradiol  0.1 mg Transdermal Weekly  . [MAR Hold] multivitamin with minerals  1 tablet Oral Daily  . [MAR Hold] sodium chloride flush  3 mL Intravenous Q12H  . [facility][MAR Hold] vitamin C  500 mg Oral Daily   Infusions:  . sodium chloride 50 mL/hr at 09/06/15 0905  . heparin 1,000 Units/hr (09/06/15 0744)    Labs:  Recent Labs  09/05/15 0905 09/06/15 0227  NA 136 138  K 3.9 4.0  CL 104 108  CO2 24 25  GLUCOSE 109* 110*  BUN 12 13  CREATININE 1.10* 0.91  CALCIUM 9.5 8.7*   No results for input(s): AST, ALT, ALKPHOS, BILITOT, PROT, ALBUMIN in the last 72 hours.  Recent Labs  09/05/15 0905 09/06/15 0227  WBC 8.9 11.6*  HGB 17.0* 15.3  HCT 50.7* 46.2  MCV 84.3 84.5  PLT 252 223    Recent Labs  09/05/15 0905 09/05/15 1324 09/05/15 1700 09/05/15 2055  TROPONINI 0.05* 0.08* 0.08* 0.07*   Invalid input(s): POCBNP No results for input(s): HGBA1C in the last 72 hours.   Weights: Filed Weights   09/05/15 0902  Weight: 160 lb (72.576 kg)     Radiology/Studies:  Dg Chest 2 View  09/05/2015  CLINICAL DATA:  Left chest pain and shortness of breath. EXAM: CHEST  2 VIEW COMPARISON:  None. FINDINGS: The heart size and mediastinal contours are within normal limits. There is no focal infiltrate, pulmonary edema, or pleural effusion.  Degenerative joint changes of the spine are identified. IMPRESSION: No active cardiopulmonary disease. Electronically Signed   By: Sherian Rein M.D.   On: 09/05/2015 09:42     Assessment and Recommendation  55 y.o. female with no evidence of significant cardiovascular risk factors having atypical chest discomfort in the left upper chest radiating into the back without  evidence of myocardial infarction or EKG changes with a cardiac catheterization showing normal coronary arteries. It appears that this is likely inflammatory chest pain 1. No further cardiac intervention at this time 2. Begin ambulation and follow for need for other treatment options of chest pain including nonsteroidal medication management 3. Okay for discharge to home from cardiac standpoint  Signed, Arnoldo Hooker M.D. FACC

## 2015-09-06 NOTE — Progress Notes (Signed)
Post cardiac cath and ct angio, patient returned to the unit, denies pain or discomfort, vss family at bedside, will continue to monitor

## 2015-09-06 NOTE — Progress Notes (Signed)
ANTICOAGULATION CONSULT NOTE - Initial Consult  Pharmacy Consult for Heparin Indication: chest pain/ACS  No Known Allergies  Patient Measurements: Height: 5\' 6"  (167.6 cm) Weight: 160 lb (72.576 kg) IBW/kg (Calculated) : 59.3 Heparin Dosing Weight: 72.6 kg  Vital Signs: Temp: 98.1 F (36.7 C) (04/26 0447) Temp Source: Oral (04/26 0447) BP: 120/69 mmHg (04/26 0447) Pulse Rate: 81 (04/26 0447)  Labs:  Recent Labs  09/05/15 0905 09/05/15 0955 09/05/15 1324 09/05/15 1700 09/05/15 2055 09/06/15 0227 09/06/15 0841  HGB 17.0*  --   --   --   --  15.3  --   HCT 50.7*  --   --   --   --  46.2  --   PLT 252  --   --   --   --  223  --   APTT  --  30  --   --   --   --   --   LABPROT  --  12.8  --   --   --   --   --   INR  --  0.94  --   --   --   --   --   HEPARINUNFRC  --   --   --  0.29*  --  0.68 0.70  CREATININE 1.10*  --   --   --   --  0.91  --   TROPONINI 0.05*  --  0.08* 0.08* 0.07*  --   --     Estimated Creatinine Clearance: 69.6 mL/min (by C-G formula based on Cr of 0.91).    Assessment: 55 yo female here with chest pain, r/o ACS.   Current orders for heparin 1000 units/hr  Goal of Therapy:  Heparin level 0.3-0.7 units/ml Monitor platelets by anticoagulation protocol: Yes   Plan:  Heparin level therapeutic x 2, will continue at current rate and recheck heparin level and CBC with AM labs  Garlon HatchetJody Yona Kosek, PharmD Clinical Pharmacist   09/06/2015,11:08 AM

## 2015-09-06 NOTE — Discharge Instructions (Signed)

## 2015-09-06 NOTE — Progress Notes (Signed)
Patient off the unit to lab for stress test

## 2015-09-06 NOTE — Progress Notes (Signed)
ANTICOAGULATION CONSULT NOTE - Initial Consult  Pharmacy Consult for Heparin Indication: chest pain/ACS  No Known Allergies  Patient Measurements: Height: 5\' 6"  (167.6 cm) Weight: 160 lb (72.576 kg) IBW/kg (Calculated) : 59.3 Heparin Dosing Weight: 72.6 kg  Vital Signs: Temp: 97.9 F (36.6 C) (04/25 1918) Temp Source: Oral (04/25 1918) BP: 122/68 mmHg (04/25 1918) Pulse Rate: 88 (04/25 1918)  Labs:  Recent Labs  09/05/15 0905 09/05/15 0955 09/05/15 1324 09/05/15 1700 09/05/15 2055 09/06/15 0227  HGB 17.0*  --   --   --   --  15.3  HCT 50.7*  --   --   --   --  46.2  PLT 252  --   --   --   --  223  APTT  --  30  --   --   --   --   LABPROT  --  12.8  --   --   --   --   INR  --  0.94  --   --   --   --   HEPARINUNFRC  --   --   --  0.29*  --  0.68  CREATININE 1.10*  --   --   --   --  0.91  TROPONINI 0.05*  --  0.08* 0.08* 0.07*  --     Estimated Creatinine Clearance: 69.6 mL/min (by C-G formula based on Cr of 0.91).    Assessment: 55 yo female here with chest pain, r/o ACS. No PTA meds entered yet, no anticoagulants noted in Care Everywhere, no anticoagulants per RN.  Hgb 17.0, Plt 252 INR and aPTT pending  Goal of Therapy:  Heparin level 0.3-0.7 units/ml Monitor platelets by anticoagulation protocol: Yes   Plan:  First heparin level therapeutic. Continue current rate. Will recheck in 6 hours.  Carola FrostNathan A Jeanluc Wegman, Pharm.D., BCPS Clinical Pharmacist 09/06/2015,3:15 AM

## 2015-09-06 NOTE — Progress Notes (Signed)
Patient discharged home as per order, discharge instruction provided, iv removed, tele removed, patient discharged home

## 2015-09-06 NOTE — Progress Notes (Signed)
[facility] [place] [facility]        Kathy Avila was admitted to the Hospital on 09/05/2015 and Discharged  09/06/2015 and should be excused from work/school   For 5 days starting 09/05/2015 , may return to work/school without any restrictions.  Call Auburn BilberryShreyang Missael Ferrari MD with questions.  Auburn BilberryPATEL, Jerrelle Michelsen M.D on 09/06/2015,at 2:04 PM  [facility] [place] [facility]    Office  [phone]

## 2015-09-06 NOTE — Progress Notes (Signed)
Bedside report received, patient resting in the bed, denies any pain or discomfort, vss, hep drip infusing at 10ml /hr, no active bleeding noted

## 2015-09-07 NOTE — Discharge Summary (Signed)
Kathy Avila, 55 y.o., DOB 1961/01/22, MRN [id number]. Admission date: 09/05/2015 Discharge Date 09/07/2015 Primary MD Lenice Llamas, NP Admitting Physician Alford Highland, MD  Admission Diagnosis  NSTEMI (non-ST elevated myocardial infarction) [facility]) [I21.4]  Discharge Diagnosis   Active Problems:   Chest pain noncardiac status post cardiac catheter with normal coronaries Anxiety and depression       Hospital Course patient is a 55 year old female presented with chest pain. She continued complaining of chest pressure troponin was slightly abnormal. Therefore she was admitted to the hospital for further evaluation. She continued to have pressure. Therefore cardiology decided to take her to the cardiac catheter. Her cardiac catheter was completely normal. Patient also had a CT scan of the chest which was negative for pulmonary embolism. Her symptoms are likely GI related or musculoskeletal. At this time she is stable for discharge.           Consults  cardiology  Significant Tests:  See full reports for all details      Dg Chest 2 View  09/05/2015  CLINICAL DATA:  Left chest pain and shortness of breath. EXAM: CHEST  2 VIEW COMPARISON:  None. FINDINGS: The heart size and mediastinal contours are within normal limits. There is no focal infiltrate, pulmonary edema, or pleural effusion. Degenerative joint changes of the spine are identified. IMPRESSION: No active cardiopulmonary disease. Electronically Signed   By: Sherian Rein M.D.   On: 09/05/2015 09:42   Ct Angio Chest Pe W/cm &/or Wo Cm  09/06/2015  CLINICAL DATA:  Left-sided chest pain with shortness of breath since yesterday. EXAM: CT ANGIOGRAPHY CHEST WITH CONTRAST TECHNIQUE: Multidetector CT imaging of the chest was performed using the standard protocol during bolus administration of intravenous contrast. Multiplanar CT image reconstructions and MIPs were obtained to evaluate the vascular anatomy. CONTRAST:  75 ml Isovue  370 COMPARISON:  Chest radiographs 09/05/2015 FINDINGS: Mediastinum: The pulmonary arteries are well opacified with contrast. There is no evidence of acute pulmonary embolism. No significant atherosclerosis. The heart size is normal. There is no pericardial effusion. There are no enlarged mediastinal, hilar or axillary lymph nodes. The thyroid gland, trachea and esophagus demonstrate no significant findings. Lungs/Pleura: There is no pleural effusion.Aside from minimal biapical scarring, the lungs are clear. Upper abdomen: Unremarkable.  There is no adrenal mass. Musculoskeletal/Chest wall: No chest wall lesion or acute osseous findings.Small Schmorl's node involving the superior endplate at T7 does not appear acute. Review of the MIP images confirms the above findings. IMPRESSION: No evidence of acute pulmonary embolism or other acute chest process. Electronically Signed   By: Carey Bullocks M.D.   On: 09/06/2015 16:00       Today   Subjective:   Kathy Avila  Feels wells no cp or sob  Objective:   Blood pressure 141/75, pulse 72, temperature 97.2 F (36.2 C), temperature source Oral, resp. rate 18, height  (1.676 m), weight 72.576 kg (160 lb), SpO2 99 %.  .  Intake/Output Summary (Last 24 hours) at 09/07/15 1334 Last data filed at 09/06/15 1753  Gross per 24 hour  Intake      0 ml  Output      0 ml  Net      0 ml    Exam VITAL SIGNS: Blood pressure 141/75, pulse 72, temperature 97.2 F (36.2 C), temperature source Oral, resp. rate 18, height  (1.676 m), weight 72.576 kg (160 lb), SpO2 99 %.  GENERAL:  55 y.o.-year-old patient  lying in the bed with no acute distress.  EYES: Pupils equal, round, reactive to light and accommodation. No scleral icterus. Extraocular muscles intact.  HEENT: Head atraumatic, normocephalic. Oropharynx and nasopharynx clear.  NECK:  Supple, no jugular venous distention. No thyroid enlargement, no tenderness.  LUNGS: Normal breath sounds  bilaterally, no wheezing, rales,rhonchi or crepitation. No use of accessory muscles of respiration.  CARDIOVASCULAR: S1, S2 normal. No murmurs, rubs, or gallops.  ABDOMEN: Soft, nontender, nondistended. Bowel sounds present. No organomegaly or mass.  EXTREMITIES: No pedal edema, cyanosis, or clubbing.  NEUROLOGIC: Cranial nerves II through XII are intact. Muscle strength 5/5 in all extremities. Sensation intact. Gait not checked.  PSYCHIATRIC: The patient is alert and oriented x 3.  SKIN: No obvious rash, lesion, or ulcer.   Data Review     CBC w Diff: Lab Results  Component Value Date   WBC 11.6* 09/06/2015   HGB 15.3 09/06/2015   HCT 46.2 09/06/2015   PLT 223 09/06/2015   CMP: Lab Results  Component Value Date   NA 138 09/06/2015   K 4.0 09/06/2015   CL 108 09/06/2015   CO2 25 09/06/2015   BUN 13 09/06/2015   CREATININE 0.91 09/06/2015  .  Micro Results No results found for this or any previous visit (from the past 240 hour(s)).   Code Status History    Date Active Date Inactive Code Status Order ID Comments User Context   09/05/2015 11:13 AM 09/06/2015  9:21 PM Full Code [id number]  Alford Highlandichard Wieting, MD ED          Follow-up Information    Follow up with BROWN, LESLIE A, NP In 7 days.   Specialty:  Nurse Practitioner   Why:  Monday, May 1st at 415pm, ccs   Contact information:   [address] [place] [id number] [phone]       Discharge Medications     Medication List    TAKE these medications        buPROPion 75 MG tablet  Commonly known as:  WELLBUTRIN  Take 75 mg by mouth 2 (two) times daily.     cholecalciferol 1000 units tablet  Commonly known as:  VITAMIN D  Take 1,000 Units by mouth daily.     cyanocobalamin 1000 MCG/ML injection  Commonly known as:  (VITAMIN B-12)  Inject 1,000 mcg into the muscle once a week.     estradiol 0.075 MG/24HR  Commonly known as:  VIVELLE-DOT  Place 1 patch onto the skin once a week.      ibuprofen 200 MG tablet  Commonly known as:  MOTRIN IB  Take 1 tablet (200 mg total) by mouth every 6 (six) hours as needed.     multivitamin with minerals Tabs tablet  Take 1 tablet by mouth daily.     pantoprazole 40 MG tablet  Commonly known as:  PROTONIX  Take 1 tablet (40 mg total) by mouth daily.     phytonadione 5 MG tablet  Commonly known as:  VITAMIN K  Take 5 mg by mouth daily.     vitamin C 500 MG tablet  Commonly known as:  ASCORBIC ACID  Take 500 mg by mouth daily.           Total Time in preparing paper work, data evaluation and todays exam - 35 minutes  Auburn BilberryPATEL, Gabbriella Presswood M.D on 09/07/2015 at 1:34 PM  [facility]   Office  [phone]

## 2015-10-24 LAB — EXERCISE TOLERANCE TEST
CHL CUP RESTING HR STRESS: 109 {beats}/min
CSEPED: 3 min
CSEPPHR: 150 {beats}/min
Estimated workload: 4.6 METS
Exercise duration (sec): 1 s

## 2016-01-26 ENCOUNTER — Ambulatory Visit: Payer: 59 | Admitting: Family Medicine

## 2016-03-01 ENCOUNTER — Other Ambulatory Visit: Payer: Self-pay | Admitting: Nurse Practitioner

## 2016-03-01 DIAGNOSIS — Z1231 Encounter for screening mammogram for malignant neoplasm of breast: Secondary | ICD-10-CM

## 2016-03-27 ENCOUNTER — Ambulatory Visit: Payer: 59

## 2016-05-08 ENCOUNTER — Ambulatory Visit
Admission: RE | Admit: 2016-05-08 | Discharge: 2016-05-08 | Disposition: A | Discharge status: Home / Self Care | Payer: 59 | Source: Ambulatory Visit | Attending: Nurse Practitioner | Admitting: Nurse Practitioner

## 2016-05-08 DIAGNOSIS — Z1231 Encounter for screening mammogram for malignant neoplasm of breast: Secondary | ICD-10-CM

## 2016-05-15 DIAGNOSIS — J209 Acute bronchitis, unspecified: Secondary | ICD-10-CM | POA: Diagnosis not present

## 2016-06-05 DIAGNOSIS — E538 Deficiency of other specified B group vitamins: Secondary | ICD-10-CM | POA: Diagnosis not present

## 2016-06-05 DIAGNOSIS — N951 Menopausal and female climacteric states: Secondary | ICD-10-CM | POA: Diagnosis not present

## 2016-07-16 DIAGNOSIS — R002 Palpitations: Secondary | ICD-10-CM | POA: Insufficient documentation

## 2016-07-16 DIAGNOSIS — Z124 Encounter for screening for malignant neoplasm of cervix: Secondary | ICD-10-CM | POA: Diagnosis not present

## 2016-07-16 DIAGNOSIS — Z01419 Encounter for gynecological examination (general) (routine) without abnormal findings: Secondary | ICD-10-CM | POA: Diagnosis not present

## 2016-07-16 LAB — HM PAP SMEAR: HM Pap smear: NEGATIVE

## 2016-07-24 DIAGNOSIS — Z7689 Persons encountering health services in other specified circumstances: Secondary | ICD-10-CM | POA: Diagnosis not present

## 2016-07-26 DIAGNOSIS — E538 Deficiency of other specified B group vitamins: Secondary | ICD-10-CM | POA: Diagnosis not present

## 2016-07-26 DIAGNOSIS — N951 Menopausal and female climacteric states: Secondary | ICD-10-CM | POA: Diagnosis not present

## 2016-08-06 ENCOUNTER — Ambulatory Visit (INDEPENDENT_AMBULATORY_CARE_PROVIDER_SITE_OTHER): Payer: 59 | Admitting: Family Medicine

## 2016-08-06 ENCOUNTER — Encounter: Payer: Self-pay | Admitting: Family Medicine

## 2016-08-06 ENCOUNTER — Encounter (INDEPENDENT_AMBULATORY_CARE_PROVIDER_SITE_OTHER): Payer: Self-pay

## 2016-08-06 VITALS — BP 130/80 | HR 89 | Temp 98.5°F | Ht 65.75 in | Wt 166.0 lb

## 2016-08-06 DIAGNOSIS — F419 Anxiety disorder, unspecified: Secondary | ICD-10-CM | POA: Diagnosis not present

## 2016-08-06 DIAGNOSIS — E538 Deficiency of other specified B group vitamins: Secondary | ICD-10-CM | POA: Diagnosis not present

## 2016-08-06 DIAGNOSIS — N951 Menopausal and female climacteric states: Secondary | ICD-10-CM

## 2016-08-06 DIAGNOSIS — R000 Tachycardia, unspecified: Secondary | ICD-10-CM | POA: Diagnosis not present

## 2016-08-06 DIAGNOSIS — K219 Gastro-esophageal reflux disease without esophagitis: Secondary | ICD-10-CM | POA: Diagnosis not present

## 2016-08-06 DIAGNOSIS — Z7189 Other specified counseling: Secondary | ICD-10-CM | POA: Diagnosis not present

## 2016-08-06 NOTE — Patient Instructions (Signed)
Go to the lab on the way out.  We'll contact you with your lab report. Let me get your records in the meantime and we'll go from there.  Take care.  Glad to see you.

## 2016-08-06 NOTE — Progress Notes (Signed)
New patient.  Requesting records.  H/o heart racing.  On propranolol.  H/o heart cath that was neg for obstructive disease. Advised patient not to take stimulants like Adderall or phentermine.  HRT for menopausal sx.  .  Has implanted estrogen pellet.  Still on progesterone by mouth.  Per outside clinic. Requesting records.  B12 def. Last shot was 2 weeks ago.  Requesting records.  She has apparently been on weekly injections for an extended period of time per outside clinic.  Anxiety.  Long-standing per patient report. Has been on Wellbutrin for years. Requesting outside records.  GERD.  On zantac 150mg  BID.   Controlled with current medication. No adverse effect on medication.  PMH and SH reviewed  ROS: Per HPI unless specifically indicated in ROS section   Meds, vitals, and allergies reviewed.   GEN: nad, alert and oriented HEENT: mucous membranes moist NECK: supple w/o LA CV: rrr.  no murmur PULM: ctab, no inc wob ABD: soft, +bs EXT: no edema

## 2016-08-06 NOTE — Progress Notes (Signed)
Pre visit review using our clinic review tool, if applicable. No additional management support is needed unless otherwise documented below in the visit note. 

## 2016-08-07 ENCOUNTER — Encounter: Payer: Self-pay | Admitting: Family Medicine

## 2016-08-07 DIAGNOSIS — K219 Gastro-esophageal reflux disease without esophagitis: Secondary | ICD-10-CM | POA: Insufficient documentation

## 2016-08-07 DIAGNOSIS — E538 Deficiency of other specified B group vitamins: Secondary | ICD-10-CM | POA: Insufficient documentation

## 2016-08-07 DIAGNOSIS — F419 Anxiety disorder, unspecified: Secondary | ICD-10-CM | POA: Insufficient documentation

## 2016-08-07 DIAGNOSIS — R000 Tachycardia, unspecified: Secondary | ICD-10-CM | POA: Insufficient documentation

## 2016-08-07 DIAGNOSIS — Z7189 Other specified counseling: Secondary | ICD-10-CM | POA: Insufficient documentation

## 2016-08-07 DIAGNOSIS — N951 Menopausal and female climacteric states: Secondary | ICD-10-CM | POA: Insufficient documentation

## 2016-08-07 NOTE — Assessment & Plan Note (Signed)
Requesting old records. Recheck B12 level today. See notes on labs. >30 minutes spent in face to face time with patient, >50% spent in counselling or coordination of care

## 2016-08-07 NOTE — Assessment & Plan Note (Signed)
Continue current meds. Controlled. Requesting records.

## 2016-08-07 NOTE — Assessment & Plan Note (Signed)
Per outside clinic. Requesting records.

## 2016-08-07 NOTE — Assessment & Plan Note (Addendum)
Controlled with current meds. Continue as is.

## 2016-08-07 NOTE — Assessment & Plan Note (Signed)
Living will discussed with patient. Kathy Avila designated if patient were incapacitated.  

## 2016-08-07 NOTE — Assessment & Plan Note (Signed)
Currently treated with beta blocker. Requesting outside records. Will review old records. Advised to avoid stimulants. No chest pain. Compliant with medication.

## 2016-08-08 ENCOUNTER — Other Ambulatory Visit: Payer: Self-pay | Admitting: Family Medicine

## 2016-08-08 DIAGNOSIS — E538 Deficiency of other specified B group vitamins: Secondary | ICD-10-CM

## 2016-08-08 LAB — VITAMIN B12: Vitamin B-12: 668 pg/mL (ref 232–1245)

## 2016-08-14 ENCOUNTER — Encounter: Payer: Self-pay | Admitting: Family Medicine

## 2016-08-16 ENCOUNTER — Encounter: Payer: Self-pay | Admitting: Family Medicine

## 2016-08-28 ENCOUNTER — Other Ambulatory Visit (INDEPENDENT_AMBULATORY_CARE_PROVIDER_SITE_OTHER): Payer: 59

## 2016-08-28 DIAGNOSIS — N950 Postmenopausal bleeding: Secondary | ICD-10-CM | POA: Diagnosis not present

## 2016-08-28 DIAGNOSIS — E538 Deficiency of other specified B group vitamins: Secondary | ICD-10-CM

## 2016-08-28 NOTE — Addendum Note (Signed)
Addended by: Alvina Chou on: 08/28/2016 04:48 PM   Modules accepted: Orders

## 2016-08-30 LAB — VITAMIN B12: Vitamin B-12: 603 pg/mL (ref 232–1245)

## 2016-09-01 ENCOUNTER — Encounter: Payer: Self-pay | Admitting: Family Medicine

## 2016-09-01 NOTE — Progress Notes (Signed)
Old records reviewed.   Per old records, pap 2018. Per old records, referral prev done for colonoscopy. Reasonable for her to follow through with this if not prev done.   I don't see HIV/HCV/tetanus history in outside records.  Reasonable for her to follow through with these if not prev done.  Let me know if she needs an order(s).  I am deferring HRT to gyn clinic.    See notes on B12 result.    Thanks.  09/02/2016  Voice mailbox has not been set up yet.  Will try again later this afternoon.    Ninetta Lights, CMA  Patient advised.  Patient says she will get a copy of the HIV/HCV sent from her OB/GYN office and also a copy of an [place] of her cervix and uterine lining sent for our records.  Tetanus will need to be updated here at her next OV.  Patient will follow through with the colonoscopy per previous referral.  Patient was waiting to get things worked out at her workplace to be out for a day.  Order for B12 mailed to patient.

## 2016-09-04 ENCOUNTER — Encounter: Payer: Self-pay | Admitting: Obstetrics

## 2016-10-14 ENCOUNTER — Telehealth: Payer: Self-pay

## 2016-10-14 DIAGNOSIS — M549 Dorsalgia, unspecified: Secondary | ICD-10-CM | POA: Diagnosis not present

## 2016-10-14 NOTE — Telephone Encounter (Signed)
Pt was lifting suitcase possibly lifting too high over weekend and 10/13/16 in AM having pain at coccyx area; pt thinks may have pulled muscle; hurts worse when getting up or down in chair, hurts some when walking but the grabbing pain occurs when getting up or down. pt scheduled appt with Dr Ermalene SearingBedsole 10/15/16 at 3:15; pt does not have transportation today and hopes to work on 10/15/16 due to point system at work. Advised pt 1st 48 hrs to try cold pack 20 mins q 4 h to area that hurts; after 48 hrs try moist warm towel or heating pad 10 mins three times a day; also can take tylenol or ibuprofen for pain. Pt voiced understanding and will cb if pain worsens or changes. FYI to Dr Ermalene SearingBedsole.

## 2016-10-15 ENCOUNTER — Ambulatory Visit (INDEPENDENT_AMBULATORY_CARE_PROVIDER_SITE_OTHER): Payer: 59 | Admitting: Family Medicine

## 2016-10-15 ENCOUNTER — Encounter: Payer: Self-pay | Admitting: Family Medicine

## 2016-10-15 VITALS — BP 118/70 | HR 68 | Temp 98.7°F | Ht 65.75 in | Wt 171.0 lb

## 2016-10-15 DIAGNOSIS — M8580 Other specified disorders of bone density and structure, unspecified site: Secondary | ICD-10-CM

## 2016-10-15 DIAGNOSIS — M533 Sacrococcygeal disorders, not elsewhere classified: Secondary | ICD-10-CM | POA: Diagnosis not present

## 2016-10-15 MED ORDER — DICLOFENAC SODIUM 75 MG PO TBEC: 75.0000 mg | DELAYED_RELEASE_TABLET | Freq: Two times a day (BID) | ORAL | 0 refills | Status: DC

## 2016-10-15 MED ORDER — TRAMADOL HCL 50 MG PO TABS: 50.0000 mg | ORAL_TABLET | Freq: Three times a day (TID) | ORAL | 0 refills | Status: DC | PRN

## 2016-10-15 NOTE — Progress Notes (Signed)
   Subjective:    Patient ID: Kathy Avila, female    DOB: 04/11/1961, 56 y.o.   MRN: [id number]  HPI   56 year old female pt presents for  New onset low back pain.  She reports waking up on 10/13/2016 with low back pain, buttock pain Pain worsened in last 2 days after lifting suitcase. No recent fall, no change in activity except some increased walking at beach.   Pain at coccyx, right side worse, pain with BM. 8-9/10 on [pain scale.  No radiation of pain, no numbness or weakness in legs. Tried heat, tylenol with minimal relief.  No fever, no incontinence. No unexpected weight loss.  not keeping her up at night.   Hx of osteopenia, nonsmoker   Review of Systems  Constitutional: Negative for fatigue and fever.  HENT: Negative for ear pain.   Eyes: Negative for pain.  Respiratory: Negative for chest tightness and shortness of breath.   Cardiovascular: Negative for chest pain, palpitations and leg swelling.  Gastrointestinal: Negative for abdominal pain.  Genitourinary: Negative for dysuria.       Objective:   Physical Exam  Constitutional: Vital signs are normal. She appears well-developed and well-nourished. She is cooperative.  Non-toxic appearance. She does not appear ill. No distress.  Cannot sit on buttocks without pain, has to slide buttock  Forward to cause no pressure  HENT:  Head: Normocephalic.  Right Ear: Hearing, tympanic membrane, external ear and ear canal normal. Tympanic membrane is not erythematous, not retracted and not bulging.  Left Ear: Hearing, tympanic membrane, external ear and ear canal normal. Tympanic membrane is not erythematous, not retracted and not bulging.  Nose: No mucosal edema or rhinorrhea. Right sinus exhibits no maxillary sinus tenderness and no frontal sinus tenderness. Left sinus exhibits no maxillary sinus tenderness and no frontal sinus tenderness.  Mouth/Throat: Uvula is midline, oropharynx is clear and moist and mucous membranes  are normal.  Eyes: Conjunctivae, EOM and lids are normal. Pupils are equal, round, and reactive to light. Lids are everted and swept, no foreign bodies found.  Neck: Trachea normal and normal range of motion. Neck supple. Carotid bruit is not present. No thyroid mass and no thyromegaly present.  Cardiovascular: Normal rate, regular rhythm, S1 normal, S2 normal, normal heart sounds, intact distal pulses and normal pulses.  Exam reveals no gallop and no friction rub.   No murmur heard. Pulmonary/Chest: Effort normal and breath sounds normal. No tachypnea. No respiratory distress. She has no decreased breath sounds. She has no wheezes. She has no rhonchi. She has no rales.  Abdominal: Soft. Normal appearance and bowel sounds are normal. There is no tenderness.  Musculoskeletal:       Lumbar back: She exhibits tenderness. She exhibits normal range of motion and no bony tenderness.  ttp over right SI joint, positive faber's on right, neg SLR  Pain with back flexion, and pressure on buttock  Neurological: She is alert.  Skin: Skin is warm, dry and intact. No rash noted.  Psychiatric: Her speech is normal and behavior is normal. Judgment and thought content normal. Her mood appears not anxious. Cognition and memory are normal. She does not exhibit a depressed mood.          Assessment & Plan:

## 2016-10-15 NOTE — Assessment & Plan Note (Signed)
No sign of sciatica or MSK strain. Pain over SI joint, no focal coccygeal pain as in fracture. Will start with diclofenac and tramadol for breakthrough pain. Start home stretching and ice, info given.

## 2016-10-15 NOTE — Assessment & Plan Note (Signed)
Low likelyhood fracture with history and on exam but if not improving consider X-ray.

## 2016-10-15 NOTE — Patient Instructions (Addendum)
Stop tylenol and ibuprofen.  Start diclofenac  75 mg twice daily for pain and inflammation.  Start home stretching, and ice on low back and SI joint.  No heavy lifting > 10 lbs , no bending or repetitive twisting.  If not improving in 2 weeks follow up.

## 2016-10-23 ENCOUNTER — Telehealth: Payer: Self-pay | Admitting: Family Medicine

## 2016-10-23 NOTE — Telephone Encounter (Signed)
Noted. Thanks.

## 2016-10-23 NOTE — Telephone Encounter (Signed)
Patient Name: Kathy Avila  DOB: 09/02/1960    Initial Comment Caller states, she cut her finger from glass on Saturday. The finger has been bleeding for a long time, the finger is bleeding heavily.    Nurse Assessment  Nurse: Scarlette ArStandifer, RN, Heather Date/Time (Eastern Time): 10/23/2016 9:49:47 AM  Confirm and document reason for call. If symptomatic, describe symptoms. ---Caller states, she cut her finger from glass on Saturday. The finger has been bleeding for a long time, the finger is bleeding heavily.  Does the patient have any new or worsening symptoms? ---Yes  Will a triage be completed? ---Yes  Related visit to physician within the last 2 weeks? ---No  Does the PT have any chronic conditions? (i.e. diabetes, asthma, etc.) ---Yes  List chronic conditions. ---See MR  Is this a behavioral health or substance abuse call? ---No     Guidelines    Guideline Title Affirmed Question Affirmed Notes  Cuts and Lacerations [1] Bleeding AND [2] won't stop after 10 minutes of direct pressure (using correct technique)    Final Disposition User   Go to ED Now Standifer, RN, Herbert SetaHeather    Comments  Caller states that this injury happened on Saturday and it is still bleeding, but she will not be able to be seen anywhere until after 3 pm and she would prefer to be seen at the office instead of the ED. Called the office, [phone] and information given.   Referrals  GO TO FACILITY UNDECIDED   Disagree/Comply: Comply

## 2016-10-23 NOTE — Telephone Encounter (Signed)
I spoke with pt and [facility] Pt cut finger on 10/19/16 with a broken glass while washing dishes; cut is near bend of finger and each time bandage changed or pt moves finger it begins to bleed.  pt will go to UC; pt will also get tetanus updated. FYI to Dr Para Marchuncan.

## 2016-11-07 DIAGNOSIS — N951 Menopausal and female climacteric states: Secondary | ICD-10-CM | POA: Diagnosis not present

## 2016-11-08 DIAGNOSIS — N951 Menopausal and female climacteric states: Secondary | ICD-10-CM | POA: Diagnosis not present

## 2016-12-11 ENCOUNTER — Telehealth: Payer: Self-pay | Admitting: Family Medicine

## 2016-12-11 NOTE — Telephone Encounter (Signed)
[facility] - Client TELEPHONE ADVICE RECORD [facility] Patient Name: Kathy QuestERRI Fazekas DOB: 10/26/1960 Initial Comment Caller states she has had sharp abd pains on right side since Sunday. Worst in the morning. Also having diarrhea and chills off and on. Nurse Assessment Nurse: Lane HackerHarley, RN, Elvin SoWindy Date/Time (Eastern Time): 12/11/2016 10:02:07 AM Confirm and document reason for call. If symptomatic, describe symptoms. ---Caller states she has had sharp right mid to lower abdominal pains since Sunday. Worst in the morning. Nagging pain at times off/on. Rates pain now at 5-6/10 while sitting, and when she goes to get up the pain becomes 10/10 quickly. While walking, reduces at 8/10. Also having loose diarrhea, no blood (twice in last 24 hrs, and started Monday), and chills off and on - no fever measured - suspects low grade. Nauseated at times, no vomiting. Does the patient have any new or worsening symptoms? ---Yes Will a triage be completed? ---Yes Related visit to physician within the last 2 weeks? ---No Does the PT have any chronic conditions? (i.e. diabetes, asthma, etc.) ---No Is this a behavioral health or substance abuse call? ---No Guidelines Guideline Title Affirmed Question Affirmed Notes Abdominal Pain - Female [1] MODERATE pain (e.g., interferes with normal activities) AND [2] pain comes and goes (cramps) AND [3] present > 24 hours (Exception: pain with Vomiting or Diarrhea - see that Guideline) Final Disposition User See Physician within [address]24 Hours [place], [place], Elvin SoWindy Comments Denies pain worsening right before diarrhea episodes, and is not relieved by diarrhea. Seems muscular. - Appt made with Dr. Dayton MartesAron for 12/12/16 at 9 am. (nothing available with Dr. Para Marchuncan). Disagree/Comply: Comply

## 2016-12-11 NOTE — Telephone Encounter (Signed)
Pt has appt with Dr Dayton MartesAron on 12/12/16 at 9 AM.

## 2016-12-12 ENCOUNTER — Ambulatory Visit: Payer: Self-pay | Admitting: Family Medicine

## 2016-12-12 NOTE — Telephone Encounter (Signed)
Pt said she cannot leave work this morning; pt said abd pain comes and goes. Pt rescheduled appt on 12/13/16 with Dr Para Marchuncan. If pain changes or worsens before appt pt will call office or go to UC> FYI to Dr Para Marchuncan.

## 2016-12-13 ENCOUNTER — Telehealth: Payer: Self-pay

## 2016-12-13 ENCOUNTER — Ambulatory Visit (INDEPENDENT_AMBULATORY_CARE_PROVIDER_SITE_OTHER): Payer: 59 | Admitting: Family Medicine

## 2016-12-13 ENCOUNTER — Ambulatory Visit (INDEPENDENT_AMBULATORY_CARE_PROVIDER_SITE_OTHER)
Admission: RE | Admit: 2016-12-13 | Discharge: 2016-12-13 | Disposition: A | Discharge status: Home / Self Care | Payer: 59 | Source: Ambulatory Visit | Attending: Family Medicine | Admitting: Family Medicine

## 2016-12-13 ENCOUNTER — Encounter: Payer: Self-pay | Admitting: Family Medicine

## 2016-12-13 DIAGNOSIS — R1031 Right lower quadrant pain: Secondary | ICD-10-CM

## 2016-12-13 LAB — CBC WITH DIFFERENTIAL/PLATELET
BASOS: 1 %
Basophils Absolute: 0 10*3/uL (ref 0.0–0.2)
EOS (ABSOLUTE): 0.2 10*3/uL (ref 0.0–0.4)
EOS: 2 %
Hematocrit: 46.5 % (ref 34.0–46.6)
Hemoglobin: 15.8 g/dL (ref 11.1–15.9)
IMMATURE GRANS (ABS): 0 10*3/uL (ref 0.0–0.1)
Immature Granulocytes: 0 %
Lymphocytes Absolute: 1.9 10*3/uL (ref 0.7–3.1)
Lymphs: 25 %
MCH: 28.1 pg (ref 26.6–33.0)
MCHC: 34 g/dL (ref 31.5–35.7)
MCV: 83 fL (ref 79–97)
MONOS ABS: 0.7 10*3/uL (ref 0.1–0.9)
Monocytes: 8 %
NEUTROS ABS: 5 10*3/uL (ref 1.4–7.0)
Neutrophils: 64 %
PLATELETS: 271 10*3/uL (ref 150–379)
RBC: 5.63 x10E6/uL — ABNORMAL HIGH (ref 3.77–5.28)
RDW: 13.6 % (ref 12.3–15.4)
WBC: 7.8 10*3/uL (ref 3.4–10.8)

## 2016-12-13 LAB — COMPREHENSIVE METABOLIC PANEL
A/G RATIO: 2 (ref 1.2–2.2)
ALBUMIN: 4.5 g/dL (ref 3.5–5.5)
ALT: 12 IU/L (ref 0–32)
AST: 22 IU/L (ref 0–40)
Alkaline Phosphatase: 77 IU/L (ref 39–117)
BUN/Creatinine Ratio: 10 (ref 9–23)
BUN: 9 mg/dL (ref 6–24)
Bilirubin Total: 0.5 mg/dL (ref 0.0–1.2)
CALCIUM: 9.9 mg/dL (ref 8.7–10.2)
CO2: 23 mmol/L (ref 20–29)
Chloride: 101 mmol/L (ref 96–106)
Creatinine, Ser: 0.92 mg/dL (ref 0.57–1.00)
GFR, EST AFRICAN AMERICAN: 79 mL/min/{1.73_m2} (ref >59)
GFR, EST NON AFRICAN AMERICAN: 69 mL/min/{1.73_m2} (ref >59)
GLOBULIN, TOTAL: 2.3 g/dL (ref 1.5–4.5)
Glucose: 86 mg/dL (ref 65–99)
POTASSIUM: 4.8 mmol/L (ref 3.5–5.2)
SODIUM: 140 mmol/L (ref 134–144)
TOTAL PROTEIN: 6.8 g/dL (ref 6.0–8.5)

## 2016-12-13 LAB — POC URINALSYSI DIPSTICK (AUTOMATED)
BILIRUBIN UA: NEGATIVE
Blood, UA: NEGATIVE
GLUCOSE UA: NEGATIVE
KETONES UA: NEGATIVE
LEUKOCYTES UA: NEGATIVE
Nitrite, UA: NEGATIVE
PH UA: 6 (ref 5.0–8.0)
Protein, UA: NEGATIVE
Spec Grav, UA: >=1.03 — AB (ref 1.010–1.025)
Urobilinogen, UA: 0.2 E.U./dL

## 2016-12-13 MED ORDER — IOPAMIDOL (ISOVUE-300) INJECTION 61%
100.0000 mL | Freq: Once | INTRAVENOUS | Status: AC | PRN
  Administered 2016-12-13: 100 mL via INTRAVENOUS

## 2016-12-13 NOTE — Telephone Encounter (Signed)
Stacy with CT called report and pt is waiting; report in Epic and carrying copy to Dr Para Marchuncan.

## 2016-12-13 NOTE — Telephone Encounter (Signed)
See result note.  Thanks!

## 2016-12-13 NOTE — Progress Notes (Signed)
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  This was no a typical week for the patient.    The pain started in the RLQ about 5 days ago.  Constant, but will wax and wane, worse when she would get up and walk.  Better at rest, sitting. some chills, no documented fevers.  Vomited yesterday and the day prior, apparently isolated episodes, ie not continuous. Some loose stools but not florid diarrhea.  "It would grab me when I would stand up."  Felt puffy locally.  Having a BM wouldn't make the pain better or worse.  No blood in stool.  She is a little better today, but pain can still flare up.  She is having less episodes of bad pain today.  No prior injury, trigger, etc.  She does a lot of lifting at work at baseline.    No abd pain (like sx this week) prior.  No burning with urination.   She was more active at work yesterday and the pain was worse, moving a lot of boxes/material at work.    Meds, vitals, and allergies reviewed.   ROS: Per HPI unless specifically indicated in ROS section   GEN: nad, alert and oriented HEENT: mucous membranes moist NECK: supple w/o LA CV: rrr.  no murmur PULM: ctab, no inc wob ABD: soft, +bs, right lower quadrant tender to palpation without rebound. Abdomen not tender to palpation otherwise EXT: no edema SKIN: no acute rash

## 2016-12-13 NOTE — Patient Instructions (Signed)
Go to the lab on the way out.  We'll contact you with your lab report. Shirlee LimerickMarion will call about your referral. If you get worse, then go to the ER.  Take care.  Glad to see you.

## 2016-12-15 DIAGNOSIS — R1031 Right lower quadrant pain: Secondary | ICD-10-CM | POA: Insufficient documentation

## 2016-12-15 NOTE — Assessment & Plan Note (Signed)
Discussed with patient. She had been having significant right lower quadrant abdominal pain last 5 days. Appendicitis is not the most likely diagnosis given her duration, but we are obligated to evaluate her for this. She could also have sigmoid diverticulitis, which occasionally presents with right lower quadrant pain. Discussed with patient. Check CT. Results reviewed, discussed with patient by phone. No sign of appendicitis or diverticulitis. It may be that she has a significant right lower quadrant abdominal wall strain. Await her pending labs in the meantime but relative rest and supportive care in the meantime. She will update me as needed. She agrees. >25 minutes spent in face to face time with patient, >50% spent in counselling or coordination of care.

## 2016-12-16 ENCOUNTER — Encounter: Payer: Self-pay | Admitting: Family Medicine

## 2016-12-16 ENCOUNTER — Telehealth: Payer: Self-pay | Admitting: Family Medicine

## 2016-12-16 NOTE — Telephone Encounter (Signed)
Patient advised.

## 2016-12-16 NOTE — Telephone Encounter (Signed)
Would use ibuprofen 600mg  TID with food PRN for pain and avoid stooping/lifting/etc.  Thanks.

## 2016-12-16 NOTE — Telephone Encounter (Signed)
Patient called Kathy Avila back.  Patient said she gave Kathy Avila the wrong last name for her Supervisor.  The correct name is Manufacturing engineerCrystal Shotwell.  Patient said she's having abdominal pain and wants to know if something can be called in for the pain. [facility].

## 2017-02-20 DIAGNOSIS — N951 Menopausal and female climacteric states: Secondary | ICD-10-CM | POA: Diagnosis not present

## 2017-02-21 DIAGNOSIS — M2550 Pain in unspecified joint: Secondary | ICD-10-CM | POA: Diagnosis not present

## 2017-02-21 DIAGNOSIS — R232 Flushing: Secondary | ICD-10-CM | POA: Diagnosis not present

## 2017-03-25 ENCOUNTER — Telehealth: Payer: Self-pay | Admitting: Family Medicine

## 2017-03-25 MED ORDER — BUPROPION HCL ER (XL) 150 MG PO TB24: 150.0000 mg | ORAL_TABLET | Freq: Every day | ORAL | 90 refills | Status: DC

## 2017-03-25 NOTE — Telephone Encounter (Signed)
Copied from CRM [phone]. Topic: Quick Communication - See Telephone Encounter >> Mar 25, 2017 11:14 AM Waymon AmatoBurton, Donna F wrote: CRM for notification. See Telephone encounter for:  Pt is needing a refill on her wellbutrin best number [phone] 03/25/17.

## 2017-05-08 DIAGNOSIS — M5489 Other dorsalgia: Secondary | ICD-10-CM | POA: Diagnosis not present

## 2017-05-29 ENCOUNTER — Other Ambulatory Visit: Payer: Self-pay | Admitting: *Deleted

## 2017-05-29 MED ORDER — BUPROPION HCL ER (XL) 150 MG PO TB24: 150.0000 mg | ORAL_TABLET | Freq: Every day | ORAL | 1 refills | Status: DC

## 2017-05-30 ENCOUNTER — Telehealth: Payer: Self-pay | Admitting: Family Medicine

## 2017-05-30 NOTE — Telephone Encounter (Signed)
Patient contacted and states that it is generic.  [facility] advised.

## 2017-05-30 NOTE — Telephone Encounter (Signed)
I don't know, I'll defer to patient.

## 2017-05-30 NOTE — Telephone Encounter (Signed)
I am unable to reach the patient to ask, do you know the answer to this question?

## 2017-05-30 NOTE — Telephone Encounter (Signed)
Copied from CRM [id number]. Topic: Quick Communication - See Telephone Encounter >> May 30, 2017 11:15 AM Windy KalataMichael, Chaya Dehaan L, NT wrote: CRM for notification. See Telephone encounter for:  05/30/17.  [facility] is calling and needs clarification on the Wellbutrin. State they need to know if the pt gets the generic brand or normal one. Please advise.  CB# [phone]

## 2017-06-17 DIAGNOSIS — M2550 Pain in unspecified joint: Secondary | ICD-10-CM | POA: Diagnosis not present

## 2017-06-17 DIAGNOSIS — R232 Flushing: Secondary | ICD-10-CM | POA: Diagnosis not present

## 2017-06-19 DIAGNOSIS — N951 Menopausal and female climacteric states: Secondary | ICD-10-CM | POA: Diagnosis not present

## 2017-06-19 DIAGNOSIS — R232 Flushing: Secondary | ICD-10-CM | POA: Diagnosis not present

## 2017-06-19 DIAGNOSIS — G479 Sleep disorder, unspecified: Secondary | ICD-10-CM | POA: Diagnosis not present

## 2017-07-18 DIAGNOSIS — Z01419 Encounter for gynecological examination (general) (routine) without abnormal findings: Secondary | ICD-10-CM | POA: Diagnosis not present

## 2017-08-08 ENCOUNTER — Other Ambulatory Visit: Payer: Self-pay | Admitting: *Deleted

## 2017-08-08 MED ORDER — BUPROPION HCL ER (XL) 150 MG PO TB24: 150.0000 mg | ORAL_TABLET | Freq: Every day | ORAL | 1 refills | Status: DC

## 2017-08-25 DIAGNOSIS — N949 Unspecified condition associated with female genital organs and menstrual cycle: Secondary | ICD-10-CM | POA: Diagnosis not present

## 2017-10-14 ENCOUNTER — Ambulatory Visit: Payer: Self-pay

## 2017-10-14 NOTE — Telephone Encounter (Signed)
Patient returned call back, she was advised she will need an earlier appointment scheduled, she says "I would like to stay with Dr. Para Marchuncan." No earlier dates available for Dr. Para Marchuncan. I asked again about seeing another provider, she says "he knows me and I would rather stay with him. If there is an earlier time on Friday, I can do that." Appointment rescheduled for Friday, 10/17/17 at 0930, patient verbalized understanding.

## 2017-10-14 NOTE — Telephone Encounter (Signed)
Pt c/o tingling from right side of neck down the arm with burning sensation to the palm of her right hand. Pt states the tingling and pain comes and goes and rates it mild. Pt states it feels like the arm is weak. 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  She also c/o 2 hours of mild intermittent chest pain 5/31. She stated that she thought it was indigestion. She stated that the pain was between her breasts. She stated she has not had chest pain since Friday. Care advice given for neck pain. Made appt for Friday but due to chest pain, will need to be seen earlier. Called pt and left message to see if pt can come in before Fri.  Reason for Disposition . Neck pain present > 2 weeks . [1] Chest pain lasts > 5 minutes AND [2] occurred > 3 days ago (72 hours) AND [3] NO chest pain or cardiac symptoms now  Answer Assessment - Initial Assessment Questions 1. ONSET: "When did the pain begin?"      4 weeks numbness and tingling to the neck. Hands have been hurting (burning) and down right arm 2. LOCATION: "Where does it hurt?"  Beck of neck down shoulder to the wrist 3. PATTERN "Does the pain come and go, or has it been constant since it started?"      Come and goes 4. SEVERITY: "How bad is the pain?"  (Scale 1-10; or mild, moderate, severe)   - MILD (1-3): doesn't interfere with normal activities    - MODERATE (4-7): interferes with normal activities or awakens from sleep    - SEVERE (8-10):  excruciating pain, unable to do any normal activities      mild 5. RADIATION: "Does the pain go anywhere else, shoot into your arms?"     Shoots to shoulder and arms 6. CORD SYMPTOMS: "Any weakness or numbness of the arms or legs?"     Yes to weakness 7. CAUSE: "What do you think is causing the neck pain?"     Pt does not know 8. NECK OVERUSE: "Any recent activities that involved turning or twisting the neck?"     no 9. OTHER SYMPTOMS: "Do you have any other  symptoms?" (e.g., headache, fever, chest pain, difficulty breathing, neck swelling)     Chest pain tightness that shoots 10. PREGNANCY: "Is there any chance you are pregnant?" "When was your last menstrual period?"       n/a  Answer Assessment - Initial Assessment Questions 1. LOCATION: "Where does it hurt?"      Between breasts 2. RADIATION: "Does the pain go anywhere else?" (e.g., into neck, jaw, arms, back)     back 3. ONSET: "When did the chest pain begin?" (Minutes, hours or days)      Had 2 hours where the pain would come and go 4. PATTERN "Does the pain come and go, or has it been constant since it started?"  "Does it get worse with exertion?"      Comes and goes- is not present during call- last present last week 5. DURATION: "How long does it last" (e.g., seconds, minutes, hours)     2 hours 6. SEVERITY: "How bad is the pain?"  (e.g., Scale 1-10; mild, moderate, or severe)    - MILD (1-3): doesn't interfere with normal activities     - MODERATE (4-7): interferes with normal activities or awakens from sleep    - SEVERE (8-10):  excruciating pain, unable to do any normal activities       mild 7. CARDIAC RISK FACTORS: "Do you have any history of heart problems or risk factors for heart disease?" (e.g., prior heart attack, angina; high blood pressure, diabetes, being overweight, high cholesterol, smoking, or strong family history of heart disease)     Overweight 8. PULMONARY RISK FACTORS: "Do you have any history of lung disease?"  (e.g., blood clots in lung, asthma, emphysema, birth control pills)     no 9. CAUSE: "What do you think is causing the chest pain?"     stress 10. OTHER SYMPTOMS: "Do you have any other symptoms?" (e.g., dizziness, nausea, vomiting, sweating, fever, difficulty breathing, cough)       fatigue 11. PREGNANCY: "Is there any chance you are pregnant?" "When was your last menstrual period?"       n/a  Protocols used: NECK PAIN OR STIFFNESS-A-AH, CHEST  PAIN-A-AH

## 2017-10-17 ENCOUNTER — Ambulatory Visit: Payer: 59 | Admitting: Family Medicine

## 2017-10-17 ENCOUNTER — Encounter: Payer: Self-pay | Admitting: Family Medicine

## 2017-10-17 ENCOUNTER — Ambulatory Visit (INDEPENDENT_AMBULATORY_CARE_PROVIDER_SITE_OTHER)
Admission: RE | Admit: 2017-10-17 | Discharge: 2017-10-17 | Disposition: A | Discharge status: Home / Self Care | Payer: 59 | Source: Ambulatory Visit | Attending: Family Medicine | Admitting: Family Medicine

## 2017-10-17 VITALS — BP 130/84 | HR 92 | Temp 98.5°F | Ht 65.75 in | Wt 180.2 lb

## 2017-10-17 DIAGNOSIS — M50323 Other cervical disc degeneration at C6-C7 level: Secondary | ICD-10-CM | POA: Diagnosis not present

## 2017-10-17 DIAGNOSIS — R202 Paresthesia of skin: Secondary | ICD-10-CM

## 2017-10-17 MED ORDER — GABAPENTIN 100 MG PO CAPS
100.0000 mg | ORAL_CAPSULE | Freq: Three times a day (TID) | ORAL | 3 refills | Status: DC
Start: 2017-10-17 — End: 2017-10-31

## 2017-10-17 NOTE — Progress Notes (Signed)
She is seeing urology about urinary incontinence. She is considering a hysterectomy.  I will defer.  She understood.  She is off B12 since 06/2017.  This is been managed through an outside clinic.  Discussed with patient.  She has B hand burning, on the palms.  Going on for months.   Now with R neck and shoulder pain and tingling and paresthesia.   No trauma.  No specific injury.   Can have R arm radicular sx.  No L sided radicular sx.   No leg sx.   R handed.   No rash.    Meds, vitals, and allergies reviewed.   ROS: Per HPI unless specifically indicated in ROS section   GEN: nad, alert and oriented HEENT: mucous membranes moist NECK: supple w/o LA, Normal neck ROM.  CV: rrr.  PULM: ctab, no inc wob ABD: soft, +bs EXT: no edema SKIN: no acute rash S/S wnl BUE and BLE but tinel pos R>L wrist.

## 2017-10-17 NOTE — Patient Instructions (Addendum)
Go to the lab on the way out.  We'll contact you with your lab and xray report. Get an over the counter wrist brace.  Wear it as much as you can in the day and then sleep in it.   You may only needed it for the right hand but you may need both.  You can try gabapentin for the pain.  See if that helps.  Sedation caution.   Start with 1 pill at night and gradually go up on the dose if needed.   Take care.  Glad to see you.  Update me as needed.

## 2017-10-18 LAB — BASIC METABOLIC PANEL
BUN/Creatinine Ratio: 15 (ref 9–23)
BUN: 13 mg/dL (ref 6–24)
CALCIUM: 9.7 mg/dL (ref 8.7–10.2)
CO2: 23 mmol/L (ref 20–29)
CREATININE: 0.87 mg/dL (ref 0.57–1.00)
Chloride: 103 mmol/L (ref 96–106)
GFR calc Af Amer: 84 mL/min/{1.73_m2} (ref >59)
GFR, EST NON AFRICAN AMERICAN: 73 mL/min/{1.73_m2} (ref >59)
Glucose: 89 mg/dL (ref 65–99)
Potassium: 4.6 mmol/L (ref 3.5–5.2)
SODIUM: 140 mmol/L (ref 134–144)

## 2017-10-18 LAB — TSH: TSH: 2.14 u[IU]/mL (ref 0.450–4.500)

## 2017-10-18 LAB — VITAMIN B12: Vitamin B-12: 815 pg/mL (ref 232–1245)

## 2017-10-19 DIAGNOSIS — R202 Paresthesia of skin: Secondary | ICD-10-CM | POA: Insufficient documentation

## 2017-10-19 NOTE — Assessment & Plan Note (Signed)
She could have multiple issues going on.  She could have carpal tunnel contributing.  Reasonable to use a wrist brace.  Rationale discussed with patient.  Check plain films of her neck regarding possible radicular source.  Check B12 level just to make sure this is not contributing.  Can try gabapentin with routine cautions.  See after visit summary.  Update me as needed.  She agrees.

## 2017-10-20 ENCOUNTER — Other Ambulatory Visit: Payer: Self-pay | Admitting: Family Medicine

## 2017-10-20 ENCOUNTER — Encounter: Payer: Self-pay | Admitting: Family Medicine

## 2017-10-20 DIAGNOSIS — R202 Paresthesia of skin: Secondary | ICD-10-CM

## 2017-10-21 ENCOUNTER — Encounter: Payer: Self-pay | Admitting: Family Medicine

## 2017-10-23 ENCOUNTER — Other Ambulatory Visit: Payer: Self-pay | Admitting: Family Medicine

## 2017-10-31 ENCOUNTER — Telehealth: Payer: Self-pay | Admitting: Family Medicine

## 2017-10-31 MED ORDER — GABAPENTIN 100 MG PO CAPS: 300.0000 mg | ORAL_CAPSULE | Freq: Three times a day (TID) | ORAL | Status: DC

## 2017-10-31 NOTE — Telephone Encounter (Signed)
Would try taking gabapentin up to 300-400mg  up to 3 times a day.  Gradually increase the dose.  See if that helps and update me as needed.  Thanks.

## 2017-10-31 NOTE — Telephone Encounter (Signed)
Pt last seen 10/17/17.

## 2017-10-31 NOTE — Telephone Encounter (Signed)
Copied from CRM [phone]#[id number]. Topic: Quick Communication - See Telephone Encounter >> Oct 31, 2017  9:47 AM Jolayne Hainesaylor, Brittany L wrote: CRM for notification. See Telephone encounter for: 10/31/17.  Patient would like to know if Dr Para Marchuncan can send in something stronger for her neck/shoulder on right side. She said the gabapentin (NEURONTIN) 100 MG capsule is not helping her. Call back @ [phone] desk phone, call cell if it is after 1:30 [phone]  [facility] [place] - [address]

## 2017-11-10 DIAGNOSIS — N812 Incomplete uterovaginal prolapse: Secondary | ICD-10-CM | POA: Diagnosis not present

## 2017-11-10 DIAGNOSIS — N393 Stress incontinence (female) (male): Secondary | ICD-10-CM | POA: Diagnosis not present

## 2017-11-10 DIAGNOSIS — N952 Postmenopausal atrophic vaginitis: Secondary | ICD-10-CM | POA: Diagnosis not present

## 2017-11-10 DIAGNOSIS — M542 Cervicalgia: Secondary | ICD-10-CM | POA: Diagnosis not present

## 2017-11-10 DIAGNOSIS — N949 Unspecified condition associated with female genital organs and menstrual cycle: Secondary | ICD-10-CM | POA: Diagnosis not present

## 2017-11-19 DIAGNOSIS — M50122 Cervical disc disorder at C5-C6 level with radiculopathy: Secondary | ICD-10-CM | POA: Diagnosis not present

## 2017-11-19 DIAGNOSIS — M542 Cervicalgia: Secondary | ICD-10-CM | POA: Diagnosis not present

## 2017-11-19 DIAGNOSIS — M4802 Spinal stenosis, cervical region: Secondary | ICD-10-CM | POA: Diagnosis not present

## 2017-11-19 DIAGNOSIS — M50121 Cervical disc disorder at C4-C5 level with radiculopathy: Secondary | ICD-10-CM | POA: Diagnosis not present

## 2017-12-09 ENCOUNTER — Other Ambulatory Visit: Payer: Self-pay | Admitting: Neurological Surgery

## 2017-12-09 DIAGNOSIS — M4802 Spinal stenosis, cervical region: Secondary | ICD-10-CM | POA: Diagnosis not present

## 2017-12-23 ENCOUNTER — Telehealth: Payer: Self-pay | Admitting: Family Medicine

## 2017-12-23 ENCOUNTER — Encounter: Payer: Self-pay | Admitting: Family Medicine

## 2017-12-23 NOTE — Telephone Encounter (Signed)
Copied from CRM [id number]. Topic: Quick Communication - See Telephone Encounter >> Dec 23, 2017  9:02 AM Burchel, Abbi R wrote: CRM for notification. See Telephone encounter for: 12/23/17.   Pt has some questions about recent lab results.  Please advise.   Pt: [phone]  [phone]

## 2017-12-23 NOTE — Telephone Encounter (Addendum)
Patient is faxing over the labs from [facility] done through her workplace.  Patient has a concern about a Rubella level that is low and is recommended to be retested in a few weeks.  Patient scheduled to have neck surgery at the end of August and just wants to make sure there is no problem with this.  Fax placed in Dr. Lianne Bushyuncan's In Box.  Patient's call back number at work is [phone] or mobile number.

## 2017-12-24 NOTE — Telephone Encounter (Signed)
I'll work on this via [employer] Groupmychart message to patient.  Thanks.

## 2017-12-29 ENCOUNTER — Encounter: Payer: Self-pay | Admitting: Family Medicine

## 2017-12-30 ENCOUNTER — Encounter: Payer: Self-pay | Admitting: Family Medicine

## 2017-12-30 LAB — LAB REPORT - SCANNED
A1c: 5.3
CHOLESTEROL: 150
Creatinine, Ser: 1.25
Glucose: 77
HDL: 54
LDL CALC: 76
MUMPS ABS, IGG: 153
RUBELLA: 12.6
RUBEOLA AB, IGG: 25.1
TRIGLYCERIDES: 99

## 2018-01-01 ENCOUNTER — Telehealth: Payer: Self-pay | Admitting: Family Medicine

## 2018-01-01 ENCOUNTER — Encounter (HOSPITAL_COMMUNITY)
Admission: RE | Admit: 2018-01-01 | Discharge: 2018-01-01 | Disposition: A | Discharge status: Home / Self Care | Payer: 59 | Source: Ambulatory Visit | Attending: Neurological Surgery | Admitting: Neurological Surgery

## 2018-01-01 ENCOUNTER — Other Ambulatory Visit: Payer: Self-pay

## 2018-01-01 ENCOUNTER — Encounter (HOSPITAL_COMMUNITY): Payer: Self-pay

## 2018-01-01 ENCOUNTER — Encounter: Payer: Self-pay | Admitting: Family Medicine

## 2018-01-01 DIAGNOSIS — Z9229 Personal history of other drug therapy: Secondary | ICD-10-CM | POA: Insufficient documentation

## 2018-01-01 DIAGNOSIS — Z01812 Encounter for preprocedural laboratory examination: Secondary | ICD-10-CM | POA: Insufficient documentation

## 2018-01-01 DIAGNOSIS — Z01818 Encounter for other preprocedural examination: Secondary | ICD-10-CM | POA: Diagnosis not present

## 2018-01-01 DIAGNOSIS — M4802 Spinal stenosis, cervical region: Secondary | ICD-10-CM | POA: Diagnosis not present

## 2018-01-01 DIAGNOSIS — Z01810 Encounter for preprocedural cardiovascular examination: Secondary | ICD-10-CM | POA: Diagnosis not present

## 2018-01-01 HISTORY — DX: Presence of spectacles and contact lenses: Z97.3

## 2018-01-01 HISTORY — DX: Other specified postprocedural states: R11.2

## 2018-01-01 HISTORY — DX: Headache: R51

## 2018-01-01 HISTORY — DX: Other specified postprocedural states: Z98.890

## 2018-01-01 HISTORY — DX: Headache, unspecified: R51.9

## 2018-01-01 LAB — BASIC METABOLIC PANEL
ANION GAP: 7 (ref 5–15)
BUN: 7 mg/dL (ref 6–20)
CALCIUM: 9.2 mg/dL (ref 8.9–10.3)
CHLORIDE: 105 mmol/L (ref 98–111)
CO2: 27 mmol/L (ref 22–32)
Creatinine, Ser: 1.05 mg/dL — ABNORMAL HIGH (ref 0.44–1.00)
GFR calc non Af Amer: 57 mL/min — ABNORMAL LOW (ref >60)
Glucose, Bld: 61 mg/dL — ABNORMAL LOW (ref 70–99)
Potassium: 3.7 mmol/L (ref 3.5–5.1)
Sodium: 139 mmol/L (ref 135–145)

## 2018-01-01 LAB — TYPE AND SCREEN
ABO/RH(D): O POS
ANTIBODY SCREEN: NEGATIVE

## 2018-01-01 LAB — ABO/RH: ABO/RH(D): O POS

## 2018-01-01 LAB — CBC WITH DIFFERENTIAL/PLATELET
ABS IMMATURE GRANULOCYTES: 0 10*3/uL (ref 0.0–0.1)
BASOS PCT: 1 %
Basophils Absolute: 0.1 10*3/uL (ref 0.0–0.1)
Eosinophils Absolute: 0.1 10*3/uL (ref 0.0–0.7)
Eosinophils Relative: 2 %
HCT: 51.8 % — ABNORMAL HIGH (ref 36.0–46.0)
Hemoglobin: 16.1 g/dL — ABNORMAL HIGH (ref 12.0–15.0)
Immature Granulocytes: 0 %
Lymphocytes Relative: 30 %
Lymphs Abs: 2.8 10*3/uL (ref 0.7–4.0)
MCH: 27.2 pg (ref 26.0–34.0)
MCHC: 31.1 g/dL (ref 30.0–36.0)
MCV: 87.5 fL (ref 78.0–100.0)
MONOS PCT: 8 %
Monocytes Absolute: 0.7 10*3/uL (ref 0.1–1.0)
NEUTROS PCT: 59 %
Neutro Abs: 5.5 10*3/uL (ref 1.7–7.7)
PLATELETS: 244 10*3/uL (ref 150–400)
RBC: 5.92 MIL/uL — ABNORMAL HIGH (ref 3.87–5.11)
RDW: 13.5 % (ref 11.5–15.5)
WBC: 9.2 10*3/uL (ref 4.0–10.5)

## 2018-01-01 LAB — SURGICAL PCR SCREEN
MRSA, PCR: NEGATIVE
Staphylococcus aureus: POSITIVE — AB

## 2018-01-01 LAB — PROTIME-INR
INR: 0.95
PROTHROMBIN TIME: 12.6 s (ref 11.4–15.2)

## 2018-01-01 NOTE — Telephone Encounter (Signed)
Order mailed to patient.

## 2018-01-01 NOTE — Telephone Encounter (Signed)
I did letter for patient, with lab order.  Please mail to patient.  I sent her a Clinical cytogeneticistmychart message.  Thanks.  Rubeola titer, dx Z92.29.

## 2018-01-01 NOTE — Progress Notes (Signed)
Pt denies SOB, chest pain, and being under the care of a cardiologist. Pt denies having an echo. Pt denies having an EKG and chest x ray within the last year. Pt denies recent labs.

## 2018-01-01 NOTE — Pre-Procedure Instructions (Addendum)
   Mamie Leverserri Lea H Dexter  01/01/2018      [place] [facility] [place] - [address] [place] [id number] Phone: [phone] Fax: [phone]  [facility] [place] - [id number] [place] [address] [place] [id number] Phone: [phone] Fax: [phone]   Your procedure is scheduled on Friday, January 09, 2018  [facility]:30 A.M.  Call this number if you have problems the morning of surgery:  [phone]   Remember:  Do not eat or drink after midnight Thursday, January 08, 2018  Take these medicines the morning of surgery with A SIP OF WATER : buPROPion (WELLBUTRIN XL), propranolol (INDERAL),  If needed: ranitidine (ZANTAC) for heartburn, traMADol (ULTRAM) for pain   Stop taking Aspirin ( unless advised otherwise by your surgeon ), vitamins, fish oil and herbal medications. Do not take any NSAIDs ie: Ibuprofen, Advil, Naproxen (Aleve), Motrin, BC and Goody Powder; stop now.  Do not wear jewelry, make-up or nail polish.  Do not wear lotions, powders, or perfumes, or deodorant.  Do not shave 48 hours prior to surgery.   Do not bring valuables to the hospital.  [facility] is not responsible for any belongings or valuables.  Contacts, dentures or bridgework may not be worn into surgery.  Leave your suitcase in the car.  After surgery it may be brought to your room.  For patients admitted to the hospital, discharge time will be determined by your treatment team. Special instructions: Shower the night before and morning of surgery with CHG. Please read over the following fact sheets that you were given. Pain Booklet, Coughing and Deep Breathing, MRSA Information and Surgical Site Infection Prevention

## 2018-01-09 ENCOUNTER — Inpatient Hospital Stay (HOSPITAL_COMMUNITY)
Admission: RE | Admit: 2018-01-09 | Discharge: 2018-01-10 | DRG: 473 | Disposition: A | Discharge status: Home / Self Care | Payer: 59 | Source: Ambulatory Visit | Attending: Neurological Surgery | Admitting: Neurological Surgery

## 2018-01-09 ENCOUNTER — Encounter (HOSPITAL_COMMUNITY): Payer: Self-pay | Admitting: Surgery

## 2018-01-09 ENCOUNTER — Encounter (HOSPITAL_COMMUNITY)
Admission: RE | Disposition: A | Discharge status: Home / Self Care | Payer: Self-pay | Source: Ambulatory Visit | Attending: Neurological Surgery

## 2018-01-09 ENCOUNTER — Other Ambulatory Visit: Payer: Self-pay

## 2018-01-09 ENCOUNTER — Inpatient Hospital Stay (HOSPITAL_COMMUNITY): Payer: 59

## 2018-01-09 ENCOUNTER — Inpatient Hospital Stay (HOSPITAL_COMMUNITY): Payer: 59 | Admitting: Certified Registered Nurse Anesthetist

## 2018-01-09 DIAGNOSIS — M79601 Pain in right arm: Secondary | ICD-10-CM | POA: Diagnosis present

## 2018-01-09 DIAGNOSIS — M47812 Spondylosis without myelopathy or radiculopathy, cervical region: Secondary | ICD-10-CM | POA: Diagnosis not present

## 2018-01-09 DIAGNOSIS — R000 Tachycardia, unspecified: Secondary | ICD-10-CM | POA: Diagnosis not present

## 2018-01-09 DIAGNOSIS — Z419 Encounter for procedure for purposes other than remedying health state, unspecified: Secondary | ICD-10-CM

## 2018-01-09 DIAGNOSIS — K219 Gastro-esophageal reflux disease without esophagitis: Secondary | ICD-10-CM | POA: Diagnosis not present

## 2018-01-09 DIAGNOSIS — Z79899 Other long term (current) drug therapy: Secondary | ICD-10-CM

## 2018-01-09 DIAGNOSIS — M50222 Other cervical disc displacement at C5-C6 level: Secondary | ICD-10-CM | POA: Diagnosis present

## 2018-01-09 DIAGNOSIS — M4322 Fusion of spine, cervical region: Secondary | ICD-10-CM | POA: Diagnosis not present

## 2018-01-09 DIAGNOSIS — F329 Major depressive disorder, single episode, unspecified: Secondary | ICD-10-CM | POA: Diagnosis present

## 2018-01-09 DIAGNOSIS — M50221 Other cervical disc displacement at C4-C5 level: Secondary | ICD-10-CM | POA: Diagnosis present

## 2018-01-09 DIAGNOSIS — M5031 Other cervical disc degeneration,  high cervical region: Principal | ICD-10-CM | POA: Diagnosis present

## 2018-01-09 DIAGNOSIS — M4802 Spinal stenosis, cervical region: Secondary | ICD-10-CM | POA: Diagnosis present

## 2018-01-09 DIAGNOSIS — M5021 Other cervical disc displacement,  high cervical region: Secondary | ICD-10-CM | POA: Diagnosis present

## 2018-01-09 DIAGNOSIS — M50223 Other cervical disc displacement at C6-C7 level: Secondary | ICD-10-CM | POA: Diagnosis present

## 2018-01-09 HISTORY — PX: ANTERIOR CERVICAL DECOMPRESSION/DISCECTOMY FUSION 4 LEVELS: SHX5556

## 2018-01-09 SURGERY — ANTERIOR CERVICAL DECOMPRESSION/DISCECTOMY FUSION 4 LEVELS
Anesthesia: General | Site: Spine Cervical

## 2018-01-09 MED ORDER — MENTHOL 3 MG MT LOZG
1.0000 | LOZENGE | OROMUCOSAL | Status: DC | PRN
  Filled 2018-01-09: qty 9

## 2018-01-09 MED ORDER — OXYCODONE HCL 5 MG PO TABS
5.0000 mg | ORAL_TABLET | Freq: Once | ORAL | Status: AC | PRN
  Administered 2018-01-09: 5 mg via ORAL

## 2018-01-09 MED ORDER — OXYCODONE HCL 5 MG PO TABS
5.0000 mg | ORAL_TABLET | ORAL | Status: DC | PRN
  Administered 2018-01-09 (×2): 5 mg via ORAL
  Filled 2018-01-09: qty 1

## 2018-01-09 MED ORDER — SODIUM CHLORIDE 0.9% FLUSH: 3.0000 mL | INTRAVENOUS | Status: DC | PRN

## 2018-01-09 MED ORDER — SENNA 8.6 MG PO TABS
1.0000 | ORAL_TABLET | Freq: Two times a day (BID) | ORAL | Status: DC
  Administered 2018-01-09: 8.6 mg via ORAL
  Filled 2018-01-09: qty 1

## 2018-01-09 MED ORDER — CHLORHEXIDINE GLUCONATE CLOTH 2 % EX PADS: 6.0000 | MEDICATED_PAD | Freq: Once | CUTANEOUS | Status: DC

## 2018-01-09 MED ORDER — EPHEDRINE 5 MG/ML INJ
INTRAVENOUS | Status: AC
  Filled 2018-01-09: qty 10

## 2018-01-09 MED ORDER — OXYCODONE HCL 5 MG/5ML PO SOLN: 5.0000 mg | Freq: Once | ORAL | Status: AC | PRN

## 2018-01-09 MED ORDER — ACETAMINOPHEN 650 MG RE SUPP: 650.0000 mg | RECTAL | Status: DC | PRN

## 2018-01-09 MED ORDER — THROMBIN 20000 UNITS EX SOLR
CUTANEOUS | Status: DC | PRN
  Administered 2018-01-09: 20 mL via TOPICAL

## 2018-01-09 MED ORDER — BUPIVACAINE HCL (PF) 0.25 % IJ SOLN
INTRAMUSCULAR | Status: DC | PRN
  Administered 2018-01-09: 4 mL

## 2018-01-09 MED ORDER — FENTANYL CITRATE (PF) 250 MCG/5ML IJ SOLN
INTRAMUSCULAR | Status: AC
  Filled 2018-01-09: qty 5

## 2018-01-09 MED ORDER — ACETAMINOPHEN 325 MG PO TABS
650.0000 mg | ORAL_TABLET | ORAL | Status: DC | PRN
  Administered 2018-01-09: 650 mg via ORAL
  Filled 2018-01-09: qty 2

## 2018-01-09 MED ORDER — THROMBIN (RECOMBINANT) 5000 UNITS EX SOLR
CUTANEOUS | Status: AC
  Filled 2018-01-09: qty 5000

## 2018-01-09 MED ORDER — HYDROMORPHONE HCL 1 MG/ML IJ SOLN
0.2500 mg | INTRAMUSCULAR | Status: DC | PRN
  Administered 2018-01-09: 0.5 mg via INTRAVENOUS

## 2018-01-09 MED ORDER — LIDOCAINE 2% (20 MG/ML) 5 ML SYRINGE
INTRAMUSCULAR | Status: DC | PRN
  Administered 2018-01-09: 80 mg via INTRAVENOUS

## 2018-01-09 MED ORDER — PHENOL 1.4 % MT LIQD
1.0000 | OROMUCOSAL | Status: DC | PRN
  Administered 2018-01-10: 1 via OROMUCOSAL
  Filled 2018-01-09: qty 177

## 2018-01-09 MED ORDER — MIDAZOLAM HCL 2 MG/2ML IJ SOLN
INTRAMUSCULAR | Status: AC
  Filled 2018-01-09: qty 2

## 2018-01-09 MED ORDER — HYDROMORPHONE HCL 1 MG/ML IJ SOLN
INTRAMUSCULAR | Status: AC
  Filled 2018-01-09: qty 1

## 2018-01-09 MED ORDER — 0.9 % SODIUM CHLORIDE (POUR BTL) OPTIME
TOPICAL | Status: DC | PRN
  Administered 2018-01-09: 1000 mL

## 2018-01-09 MED ORDER — PROPOFOL 10 MG/ML IV BOLUS
INTRAVENOUS | Status: DC | PRN
  Administered 2018-01-09: 20 mg via INTRAVENOUS
  Administered 2018-01-09: 130 mg via INTRAVENOUS

## 2018-01-09 MED ORDER — METHOCARBAMOL 500 MG PO TABS
ORAL_TABLET | ORAL | Status: AC
  Filled 2018-01-09: qty 1

## 2018-01-09 MED ORDER — THROMBIN 5000 UNITS EX SOLR
OROMUCOSAL | Status: DC | PRN
  Administered 2018-01-09: 5 mL via TOPICAL

## 2018-01-09 MED ORDER — DEXAMETHASONE SODIUM PHOSPHATE 10 MG/ML IJ SOLN
10.0000 mg | INTRAMUSCULAR | Status: AC
  Administered 2018-01-09: 8 mg via INTRAVENOUS
  Filled 2018-01-09: qty 1

## 2018-01-09 MED ORDER — POTASSIUM CHLORIDE IN NACL 20-0.9 MEQ/L-% IV SOLN: INTRAVENOUS | Status: DC

## 2018-01-09 MED ORDER — SODIUM CHLORIDE 0.9 % IV SOLN
250.0000 mL | INTRAVENOUS | Status: DC
  Administered 2018-01-09: 250 mL via INTRAVENOUS

## 2018-01-09 MED ORDER — SODIUM CHLORIDE 0.9 % IV SOLN
INTRAVENOUS | Status: DC | PRN
  Administered 2018-01-09: 15 ug/min via INTRAVENOUS

## 2018-01-09 MED ORDER — CEFAZOLIN SODIUM-DEXTROSE 2-4 GM/100ML-% IV SOLN
2.0000 g | INTRAVENOUS | Status: AC
  Administered 2018-01-09: 2 g via INTRAVENOUS
  Filled 2018-01-09: qty 100

## 2018-01-09 MED ORDER — ONDANSETRON HCL 4 MG/2ML IJ SOLN
INTRAMUSCULAR | Status: AC
  Filled 2018-01-09: qty 2

## 2018-01-09 MED ORDER — DEXAMETHASONE SODIUM PHOSPHATE 4 MG/ML IJ SOLN
4.0000 mg | Freq: Four times a day (QID) | INTRAMUSCULAR | Status: DC
  Administered 2018-01-09 – 2018-01-10 (×3): 4 mg via INTRAVENOUS
  Filled 2018-01-09 (×3): qty 1

## 2018-01-09 MED ORDER — ONDANSETRON HCL 4 MG PO TABS: 4.0000 mg | ORAL_TABLET | Freq: Four times a day (QID) | ORAL | Status: DC | PRN

## 2018-01-09 MED ORDER — PROGESTERONE MICRONIZED 100 MG PO CAPS
200.0000 mg | ORAL_CAPSULE | Freq: Every day | ORAL | Status: DC
  Administered 2018-01-09: 200 mg via ORAL
  Filled 2018-01-09: qty 2

## 2018-01-09 MED ORDER — PROPOFOL 10 MG/ML IV BOLUS
INTRAVENOUS | Status: AC
  Filled 2018-01-09: qty 40

## 2018-01-09 MED ORDER — CEFAZOLIN SODIUM-DEXTROSE 2-4 GM/100ML-% IV SOLN
2.0000 g | Freq: Three times a day (TID) | INTRAVENOUS | Status: AC
  Administered 2018-01-09 (×2): 2 g via INTRAVENOUS
  Filled 2018-01-09 (×2): qty 100

## 2018-01-09 MED ORDER — SCOPOLAMINE 1 MG/3DAYS TD PT72
MEDICATED_PATCH | TRANSDERMAL | Status: DC | PRN
  Administered 2018-01-09: 1 via TRANSDERMAL

## 2018-01-09 MED ORDER — ONDANSETRON HCL 4 MG/2ML IJ SOLN
INTRAMUSCULAR | Status: DC | PRN
  Administered 2018-01-09: 4 mg via INTRAVENOUS

## 2018-01-09 MED ORDER — ROCURONIUM BROMIDE 10 MG/ML (PF) SYRINGE
PREFILLED_SYRINGE | INTRAVENOUS | Status: DC | PRN
  Administered 2018-01-09: 50 mg via INTRAVENOUS

## 2018-01-09 MED ORDER — THROMBIN (RECOMBINANT) 20000 UNITS EX SOLR
CUTANEOUS | Status: AC
  Filled 2018-01-09: qty 20000

## 2018-01-09 MED ORDER — OXYCODONE HCL 5 MG PO TABS
ORAL_TABLET | ORAL | Status: AC
  Filled 2018-01-09: qty 1

## 2018-01-09 MED ORDER — METHOCARBAMOL 500 MG PO TABS
500.0000 mg | ORAL_TABLET | Freq: Four times a day (QID) | ORAL | Status: DC | PRN
  Administered 2018-01-09 – 2018-01-10 (×4): 500 mg via ORAL
  Filled 2018-01-09 (×3): qty 1

## 2018-01-09 MED ORDER — DEXAMETHASONE 4 MG PO TABS
4.0000 mg | ORAL_TABLET | Freq: Four times a day (QID) | ORAL | Status: DC
  Administered 2018-01-10: 4 mg via ORAL
  Filled 2018-01-09: qty 1

## 2018-01-09 MED ORDER — BUPIVACAINE HCL (PF) 0.25 % IJ SOLN
INTRAMUSCULAR | Status: AC
  Filled 2018-01-09: qty 30

## 2018-01-09 MED ORDER — HEMOSTATIC AGENTS (NO CHARGE) OPTIME
TOPICAL | Status: DC | PRN
  Administered 2018-01-09: 1 via TOPICAL

## 2018-01-09 MED ORDER — PHENYLEPHRINE 40 MCG/ML (10ML) SYRINGE FOR IV PUSH (FOR BLOOD PRESSURE SUPPORT)
PREFILLED_SYRINGE | INTRAVENOUS | Status: AC
  Filled 2018-01-09: qty 10

## 2018-01-09 MED ORDER — LIDOCAINE 2% (20 MG/ML) 5 ML SYRINGE
INTRAMUSCULAR | Status: AC
  Filled 2018-01-09: qty 5

## 2018-01-09 MED ORDER — ONDANSETRON HCL 4 MG/2ML IJ SOLN: 4.0000 mg | Freq: Four times a day (QID) | INTRAMUSCULAR | Status: DC | PRN

## 2018-01-09 MED ORDER — PROMETHAZINE HCL 25 MG/ML IJ SOLN
6.2500 mg | INTRAMUSCULAR | Status: DC | PRN
  Administered 2018-01-09: 12.5 mg via INTRAVENOUS

## 2018-01-09 MED ORDER — MEPERIDINE HCL 50 MG/ML IJ SOLN: 6.2500 mg | INTRAMUSCULAR | Status: DC | PRN

## 2018-01-09 MED ORDER — ALUM & MAG HYDROXIDE-SIMETH 200-200-20 MG/5ML PO SUSP: 30.0000 mL | Freq: Four times a day (QID) | ORAL | Status: DC | PRN

## 2018-01-09 MED ORDER — HYDROMORPHONE HCL 1 MG/ML IJ SOLN: 0.5000 mg | INTRAMUSCULAR | Status: DC | PRN

## 2018-01-09 MED ORDER — SODIUM CHLORIDE 0.9% FLUSH
3.0000 mL | Freq: Two times a day (BID) | INTRAVENOUS | Status: DC
  Administered 2018-01-09 (×2): 3 mL via INTRAVENOUS

## 2018-01-09 MED ORDER — PROPRANOLOL HCL 10 MG PO TABS
10.0000 mg | ORAL_TABLET | Freq: Two times a day (BID) | ORAL | Status: DC
  Administered 2018-01-09 – 2018-01-10 (×2): 10 mg via ORAL
  Filled 2018-01-09 (×2): qty 1

## 2018-01-09 MED ORDER — METHOCARBAMOL 1000 MG/10ML IJ SOLN
500.0000 mg | Freq: Four times a day (QID) | INTRAVENOUS | Status: DC | PRN
  Filled 2018-01-09: qty 5

## 2018-01-09 MED ORDER — DEXAMETHASONE SODIUM PHOSPHATE 10 MG/ML IJ SOLN
INTRAMUSCULAR | Status: AC
  Filled 2018-01-09: qty 1

## 2018-01-09 MED ORDER — SUCCINYLCHOLINE CHLORIDE 200 MG/10ML IV SOSY
PREFILLED_SYRINGE | INTRAVENOUS | Status: AC
  Filled 2018-01-09: qty 10

## 2018-01-09 MED ORDER — SCOPOLAMINE 1 MG/3DAYS TD PT72
MEDICATED_PATCH | TRANSDERMAL | Status: AC
  Filled 2018-01-09: qty 1

## 2018-01-09 MED ORDER — BUPROPION HCL ER (XL) 300 MG PO TB24
300.0000 mg | ORAL_TABLET | Freq: Every day | ORAL | Status: DC
  Administered 2018-01-10: 300 mg via ORAL
  Filled 2018-01-09: qty 1

## 2018-01-09 MED ORDER — FENTANYL CITRATE (PF) 100 MCG/2ML IJ SOLN
INTRAMUSCULAR | Status: DC | PRN
  Administered 2018-01-09 (×4): 50 ug via INTRAVENOUS

## 2018-01-09 MED ORDER — SODIUM CHLORIDE 0.9 % IV SOLN
INTRAVENOUS | Status: DC | PRN
  Administered 2018-01-09: 500 mL

## 2018-01-09 MED ORDER — LACTATED RINGERS IV SOLN
INTRAVENOUS | Status: DC | PRN
  Administered 2018-01-09 (×2): via INTRAVENOUS

## 2018-01-09 MED ORDER — MIDAZOLAM HCL 5 MG/5ML IJ SOLN
INTRAMUSCULAR | Status: DC | PRN
  Administered 2018-01-09: 2 mg via INTRAVENOUS

## 2018-01-09 MED ORDER — OXYCODONE HCL 5 MG PO TABS
5.0000 mg | ORAL_TABLET | ORAL | Status: DC | PRN
  Administered 2018-01-09 – 2018-01-10 (×4): 5 mg via ORAL
  Filled 2018-01-09 (×4): qty 1

## 2018-01-09 MED ORDER — SUGAMMADEX SODIUM 200 MG/2ML IV SOLN
INTRAVENOUS | Status: DC | PRN
  Administered 2018-01-09: 200 mg via INTRAVENOUS

## 2018-01-09 MED ORDER — PROMETHAZINE HCL 25 MG/ML IJ SOLN
INTRAMUSCULAR | Status: AC
  Filled 2018-01-09: qty 1

## 2018-01-09 SURGICAL SUPPLY — 53 items
BAG DECANTER FOR FLEXI CONT (MISCELLANEOUS) ×2 IMPLANT
BASKET BONE COLLECTION (BASKET) IMPLANT
BENZOIN TINCTURE PRP APPL 2/3 (GAUZE/BANDAGES/DRESSINGS) ×2 IMPLANT
BIT DRILL 13 (BIT) ×2 IMPLANT
BUR MATCHSTICK NEURO 3.0 LAGG (BURR) ×2 IMPLANT
CANISTER SUCT 3000ML PPV (MISCELLANEOUS) ×2 IMPLANT
CARTRIDGE OIL MAESTRO DRILL (MISCELLANEOUS) ×1 IMPLANT
DIFFUSER DRILL AIR PNEUMATIC (MISCELLANEOUS) ×2 IMPLANT
DRAIN SNY WOU 7FLT (WOUND CARE) ×2 IMPLANT
DRAPE C-ARM 42X72 X-RAY (DRAPES) ×4 IMPLANT
DRAPE LAPAROTOMY 100X72 PEDS (DRAPES) ×2 IMPLANT
DRAPE MICROSCOPE LEICA (MISCELLANEOUS) ×2 IMPLANT
DRSG OPSITE POSTOP 4X6 (GAUZE/BANDAGES/DRESSINGS) ×2 IMPLANT
DURAPREP 6ML APPLICATOR 50/CS (WOUND CARE) ×2 IMPLANT
ELECT COATED BLADE 2.86 ST (ELECTRODE) ×2 IMPLANT
ELECT REM PT RETURN 9FT ADLT (ELECTROSURGICAL) ×2
ELECTRODE REM PT RTRN 9FT ADLT (ELECTROSURGICAL) ×1 IMPLANT
GAUZE 4X4 16PLY RFD (DISPOSABLE) IMPLANT
GLOVE BIO SURGEON STRL SZ7 (GLOVE) ×4 IMPLANT
GLOVE BIO SURGEON STRL SZ8 (GLOVE) ×4 IMPLANT
GLOVE BIOGEL PI IND STRL 7.0 (GLOVE) ×2 IMPLANT
GLOVE BIOGEL PI INDICATOR 7.0 (GLOVE) ×2
GOWN STRL REUS W/ TWL LRG LVL3 (GOWN DISPOSABLE) ×3 IMPLANT
GOWN STRL REUS W/ TWL XL LVL3 (GOWN DISPOSABLE) ×3 IMPLANT
GOWN STRL REUS W/TWL 2XL LVL3 (GOWN DISPOSABLE) IMPLANT
GOWN STRL REUS W/TWL LRG LVL3 (GOWN DISPOSABLE) ×3
GOWN STRL REUS W/TWL XL LVL3 (GOWN DISPOSABLE) ×3
HEMOSTAT POWDER KIT SURGIFOAM (HEMOSTASIS) ×2 IMPLANT
KIT BASIN OR (CUSTOM PROCEDURE TRAY) ×2 IMPLANT
KIT TURNOVER KIT B (KITS) ×2 IMPLANT
MATRIX HEMOSTAT SURGIFLO (HEMOSTASIS) ×2 IMPLANT
NEEDLE HYPO 25X1 1.5 SAFETY (NEEDLE) ×2 IMPLANT
NEEDLE SPNL 20GX3.5 QUINCKE YW (NEEDLE) ×2 IMPLANT
NS IRRIG 1000ML POUR BTL (IV SOLUTION) ×2 IMPLANT
OIL CARTRIDGE MAESTRO DRILL (MISCELLANEOUS) ×2
PACK LAMINECTOMY NEURO (CUSTOM PROCEDURE TRAY) ×2 IMPLANT
PAD ARMBOARD 7.5X6 YLW CONV (MISCELLANEOUS) ×2 IMPLANT
PIN DISTRACTION 14MM (PIN) ×4 IMPLANT
PLATE ANT ATLANTIS 67.5 4 LVL (Plate) ×2 IMPLANT
RUBBERBAND STERILE (MISCELLANEOUS) ×4 IMPLANT
SCREW ST 14X4XST VA NS SPNE (Screw) ×10 IMPLANT
SCREW ST VAR 4 ATL (Screw) ×10 IMPLANT
SPACER IDENTITI PS 5X16X14 7D (Spacer) ×4 IMPLANT
SPACER PTI-C 6X16X14 7DEG (Spacer) ×4 IMPLANT
SPONGE INTESTINAL PEANUT (DISPOSABLE) ×2 IMPLANT
SPONGE SURGIFOAM ABS GEL 100 (HEMOSTASIS) ×2 IMPLANT
STRIP CLOSURE SKIN 1/2X4 (GAUZE/BANDAGES/DRESSINGS) ×2 IMPLANT
SUT VIC AB 3-0 SH 8-18 (SUTURE) ×2 IMPLANT
SUT VICRYL 4-0 PS2 18IN ABS (SUTURE) IMPLANT
TAPE STRIPS DRAPE STRL (GAUZE/BANDAGES/DRESSINGS) ×2 IMPLANT
TOWEL GREEN STERILE (TOWEL DISPOSABLE) ×2 IMPLANT
TOWEL GREEN STERILE FF (TOWEL DISPOSABLE) ×2 IMPLANT
WATER STERILE IRR 1000ML POUR (IV SOLUTION) ×2 IMPLANT

## 2018-01-09 NOTE — H&P (Signed)
Subjective:   Patient is a 57 y.o. female admitted for neck and R arm pain. The patient first presented to me with complaints of neck pain and shooting pains in the arm(s). Onset of symptoms was several months ago. The pain is described as aching and occurs all day. The pain is rated severe, and is located in the neck and radiates to the RUE. The symptoms have been progressive. Symptoms are exacerbated by extending head backwards, and are relieved by none.  Previous work up includes MRI of cervical spine, results: spinal stenosis.  Past Medical History:  Diagnosis Date  . Anxiety   . B12 deficiency   . Depression   . GERD (gastroesophageal reflux disease)   . Headache   . Menopausal symptoms   . PONV (postoperative nausea and vomiting)   . Wears contact lenses     Past Surgical History:  Procedure Laterality Date  . ANKLE RECONSTRUCTION Bilateral   . CARDIAC CATHETERIZATION N/A 09/06/2015   Procedure: Left Heart Cath and Coronary Angiography;  Surgeon: Lamar BlinksBruce J Kowalski, MD;  Location: [facility] IN[facility]IVE [facility];  Service: Cardiovascular;  Laterality: N/A;  . DILATION AND CURETTAGE OF UTERUS    . TONSILLECTOMY      No Known Allergies  Social History   Tobacco Use  . Smoking status: Never Smoker  . Smokeless tobacco: Never Used  Substance Use Topics  . Alcohol use: Yes    Alcohol/week: 1.0 standard drinks    Types: 1 Glasses of wine per week    Comment: occ/weekends    Family History  Problem Relation Age of Onset  . Cirrhosis Mother        not from etoh per patient report  . Throat cancer Father   . Breast cancer Neg Hx   . Colon cancer Neg Hx    Prior to Admission medications   Medication Sig Start Date End Date Taking? Authorizing Provider  buPROPion (WELLBUTRIN XL) 300 MG 24 hr tablet TAKE 1 TABLET BY MOUTH EVERY DAY Patient taking differently: Take 300 mg by mouth daily.  10/23/17  Yes Joaquim Namuncan, Graham S, MD  cholecalciferol (VITAMIN D) 1000 units tablet Take 1,000 Units by  mouth daily.   Yes [provider]  estradiol (VIVELLE-DOT) 0.1 MG/24HR patch Place 1 patch onto the skin every 3 (three) days.    Yes [provider]  ibuprofen (ADVIL,MOTRIN) 200 MG tablet Take 400-600 mg by mouth every 6 (six) hours as needed for mild pain.   Yes [provider]  MAGNESIUM CITRATE PO Take 1 tablet by mouth daily.   Yes [provider]  Multiple Vitamin (MULTIVITAMIN WITH MINERALS) TABS tablet Take 1 tablet by mouth daily.   Yes [provider]  progesterone (PROMETRIUM) 200 MG capsule Take 200 mg by mouth at bedtime.   Yes [provider]  propranolol (INDERAL) 10 MG tablet Take 10 mg by mouth 2 (two) times daily.   Yes [provider]  ranitidine (ZANTAC) 150 MG capsule Take 150 mg by mouth 2 (two) times daily as needed for heartburn.    Yes [provider]  traMADol (ULTRAM) 50 MG tablet Take 50 mg by mouth every 12 (twelve) hours as needed for moderate pain.   Yes [provider]  vitamin E 400 UNIT capsule Take 400 Units by mouth daily.   Yes [provider]  gabapentin (NEURONTIN) 100 MG capsule Take 3-4 capsules (300-400 mg total) by mouth 3 (three) times daily. Patient not taking: Reported on  12/26/2017 10/31/17   Joaquim Nam, MD     Review of Systems  Positive ROS: neg  All other systems have been reviewed and were otherwise negative with the exception of those mentioned in the HPI and as above.  Objective: Vital signs in last 24 hours: Temp:  [98.4 F (36.9 C)] 98.4 F (36.9 C) (08/30 0614) Pulse Rate:  [66] 66 (08/30 0614) Resp:  [18] 18 (08/30 0614) BP: (122)/(97) 122/97 (08/30 0614) SpO2:  [99 %] 99 % (08/30 0614)  General Appearance: Alert, cooperative, no distress, appears stated age Head: Normocephalic, without obvious abnormality, atraumatic Eyes: PERRL, conjunctiva/corneas clear, EOM's intact      Neck: Supple, symmetrical, trachea midline, Back:  Symmetric, no curvature, ROM normal, no CVA tenderness Lungs:  respirations unlabored Heart: Regular rate and rhythm Abdomen: Soft, non-tender Extremities: Extremities normal, atraumatic, no cyanosis or edema Pulses: 2+ and symmetric all extremities Skin: Skin color, texture, turgor normal, no rashes or lesions  NEUROLOGIC:  Mental status: Alert and oriented x4, no aphasia, good attention span, fund of knowledge and memory  Motor Exam - grossly normal Sensory Exam - grossly normal Reflexes: 1+ Coordination - grossly normal Gait - grossly normal Balance - grossly normal Cranial Nerves: I: smell Not tested  II: visual acuity  OS: nl    OD: nl  II: visual fields Full to confrontation  II: pupils Equal, round, reactive to light  III,VII: ptosis None  III,IV,VI: extraocular muscles  Full ROM  V: mastication Normal  V: facial light touch sensation  Normal  V,VII: corneal reflex  Present  VII: facial muscle function - upper  Normal  VII: facial muscle function - lower Normal  VIII: hearing Not tested  IX: soft palate elevation  Normal  IX,X: gag reflex Present  XI: trapezius strength  5/5  XI: sternocleidomastoid strength 5/5  XI: neck flexion strength  5/5  XII: tongue strength  Normal    Data Review Lab Results  Component Value Date   WBC 9.2 01/01/2018   HGB 16.1 (H) 01/01/2018   HCT 51.8 (H) 01/01/2018   MCV 87.5 01/01/2018   PLT 244 01/01/2018   Lab Results  Component Value Date   NA 139 01/01/2018   K 3.7 01/01/2018   CL 105 01/01/2018   CO2 27 01/01/2018   BUN 7 01/01/2018   CREATININE 1.05 (H) 01/01/2018   GLUCOSE 61 (L) 01/01/2018   Lab Results  Component Value Date   INR 0.95 01/01/2018    Assessment:   Cervical neck pain with herniated nucleus pulposus/ spondylosis/ stenosis at C3-7. Estimated body mass index is 28.21 kg/m as calculated from the following:   Height as of 01/01/18: 5\' 6"  (1.676 m).   Weight as of 01/01/18: 79.3 kg.  Patient has  failed conservative therapy. Planned surgery : ACDF with plate Z6-1 W9-6 C5-6 C6-7  Plan:   I explained the condition and procedure to the patient and answered any questions.  Patient wishes to proceed with procedure as planned. Understands risks/ benefits/ and expected or typical outcomes.  Ileen Kahre S 01/09/2018 7:09 AM

## 2018-01-09 NOTE — Anesthesia Procedure Notes (Signed)
Procedure Name: Intubation Date/Time: 01/09/2018 7:44 AM Performed by: Lowella Dell, CRNA Pre-anesthesia Checklist: Patient identified, Emergency Drugs available, Suction available and Patient being monitored Patient Re-evaluated:Patient Re-evaluated prior to induction Oxygen Delivery Method: Circle System Utilized Preoxygenation: Pre-oxygenation with 100% oxygen Induction Type: IV induction Ventilation: Mask ventilation without difficulty Laryngoscope Size: Mac and 3 Grade View: Grade I Tube type: Oral Number of attempts: 1 Airway Equipment and Method: Stylet Placement Confirmation: ETT inserted through vocal cords under direct vision,  positive ETCO2 and breath sounds checked- equal and bilateral Secured at: 19 cm Tube secured with: Tape Dental Injury: Teeth and Oropharynx as per pre-operative assessment  Comments: Positioned to pt comfort prior to induction. Head/neck maintained in neutral position throughout MV/DL.

## 2018-01-09 NOTE — Anesthesia Preprocedure Evaluation (Signed)
Anesthesia Evaluation  Patient identified by MRN, date of birth, ID band Patient awake    Reviewed: Allergy & Precautions, NPO status , Patient's Chart, lab work & pertinent test results, reviewed documented beta blocker date and time   History of Anesthesia Complications (+) PONV  Airway Mallampati: II  TM Distance: >3 FB Neck ROM: Full    Dental no notable dental hx.    Pulmonary neg pulmonary ROS,    Pulmonary exam normal breath sounds clear to auscultation       Cardiovascular negative cardio ROS Normal cardiovascular exam Rhythm:Regular Rate:Normal     Neuro/Psych  Headaches, Anxiety Depression negative neurological ROS  negative psych ROS   GI/Hepatic negative GI ROS, Neg liver ROS, GERD  ,  Endo/Other  negative endocrine ROS  Renal/GU negative Renal ROS  negative genitourinary   Musculoskeletal negative musculoskeletal ROS (+)   Abdominal   Peds negative pediatric ROS (+)  Hematology negative hematology ROS (+)   Anesthesia Other Findings   Reproductive/Obstetrics negative OB ROS                             Anesthesia Physical Anesthesia Plan  ASA: II  Anesthesia Plan: General   Post-op Pain Management:    Induction: Intravenous  PONV Risk Score and Plan: 4 or greater and Ondansetron, Dexamethasone, Midazolam and Scopolamine patch - Pre-op  Airway Management Planned: Oral ETT  Additional Equipment:   Intra-op Plan:   Post-operative Plan: Extubation in OR  Informed Consent: I have reviewed the patients History and Physical, chart, labs and discussed the procedure including the risks, benefits and alternatives for the proposed anesthesia with the patient or authorized representative who has indicated his/her understanding and acceptance.   Dental advisory given  Plan Discussed with: CRNA  Anesthesia Plan Comments:         Anesthesia Quick Evaluation

## 2018-01-09 NOTE — Op Note (Signed)
01/09/2018  11:59 AM  PATIENT:  Kathy Avila  57 y.o. female  PRE-OPERATIVE DIAGNOSIS: cervical spondylosis, degenerative disc disease C3-4 C4-5 C5-6 and C6-7, cervical spinal stenosis, neck and right arm pain   POST-OPERATIVE DIAGNOSIS:  same  PROCEDURE:  1. Decompressive anterior cervical discectomy C3-4 C4-5 C5-6 and C6-7, 2. Anterior cervical arthrodesis C3-4 C4-5 and C5-6 and C6-7 utilizing a porous titanium interbody cage packed with locally harvested morcellized autologous bone graft, 3. Anterior cervical plating C3-C7 inclusive utilizing a [employer] atlantis vision plate  SURGEON:  Marikay Alar, MD  ASSISTANTS: Meyran FNP  ANESTHESIA:   General  EBL: 400 ml  Total I/O In: 1500 [I.V.:1500] Out: 1050 [Urine:650; Blood:400]  BLOOD ADMINISTERED: none  DRAINS: none  SPECIMEN:  none  INDICATION FOR PROCEDURE: This patient presented with neck and arm pain. Imaging showed severe DDD and cervical spondylosis/ stenosis. The patient tried conservative measures without relief. Pain was debilitating. Recommended ACDF with plating. Patient understood the risks, benefits, and alternatives and potential outcomes and wished to proceed.  PROCEDURE DETAILS: Patient was brought to the operating room placed under general endotracheal anesthesia. Patient was placed in the supine position on the operating room table. The neck was prepped with Duraprep and draped in a sterile fashion.   Three cc of local anesthesia was injected and a transverse incision was made on the right side of the neck.  Dissection was carried down thru the subcutaneous tissue and the platysma was  elevated, opened, and undermined with Metzenbaum scissors.  Dissection was then carried out thru an avascular plane leaving the sternocleidomastoid carotid artery and jugular vein laterally and the trachea and esophagus medially. The ventral aspect of the vertebral column was identified and a localizing x-ray was taken. The  C3-4 level was identified. The longus colli muscles were then elevated and the retractor was placed to expose C3-4 C4-5 C5-6 and C6-7. The annulus was incised and the disc space entered.the exact same decompression was performed at all 4 levels. Discectomy was performed with micro-curettes and pituitary rongeurs. I then used the high-speed drill to drill the endplates down to the level of the posterior longitudinal ligament. The drill shavings were saved in a mucous trap for later arthrodesis. The operating microscope was draped and brought into the field provided additional magnification, illumination and visualization. Discectomy was continued posteriorly thru the disc space. Posterior longitudinal ligament was opened with a nerve hook, and then removed along with disc herniation and osteophytes, decompressing the spinal canal and thecal sac. We then continued to remove osteophytic overgrowth and disc material decompressing the neural foramina and exiting nerve roots bilaterally. The scope was angled up and down to help decompress and undercut the vertebral bodies. Once the decompression was completed we could pass a nerve hook circumferentially to assure adequate decompression in the midline and in the neural foramina. So by both visualization and palpation we felt we had an adequate decompression of the neural elements. We then measured the height of the intravertebral disc space and selected a 55 or 6 millimeter porous titanium interbody cage packed with autograft. It was then gently positioned in the intravertebral disc space(s) and countersunk. I then used a 67 mm Atlantis vision plate and placed variable angle screws into the vertebral bodies of each level and locked them into position. The wound was irrigated with bacitracin solution, checked for hemostasis which was established and confirmed. Once meticulous hemostasis was achieved,I placed a 7 flat JP drain and we then proceeded with  closure. The platysma  was closed with interrupted 3-0 undyed Vicryl suture, the subcuticular layer was closed with interrupted 3-0 undyed Vicryl suture. The skin edges were approximated with steristrips. The drapes were removed. A sterile dressing was applied. The patient was then awakened from general anesthesia and transferred to the recovery room in stable condition. At the end of the procedure all sponge, needle and instrument counts were correct.   PLAN OF CARE: Admit for overnight observation  PATIENT DISPOSITION:  PACU - hemodynamically stable.   Delay start of Pharmacological VTE agent (>24hrs) due to surgical blood loss or risk of bleeding:  yes

## 2018-01-09 NOTE — Evaluation (Signed)
Occupational Therapy Evaluation Patient Details Name: Kathy Avila MRN: [id number] DOB: 1960/06/05 Today's Date: 01/09/2018    History of Present Illness Kathy Avila is a 57yo F admitted with Cervical neck pain with herniated nucleus pulposus/ spondylosis/ stenosis at C3-7 and now s/p ACDF with plate Q6-5 H8-4 C5-6 C6-7.    Clinical Impression   Pt s/p cervical sx and now presenting with decreased R FMC and slight balance deficits. Edu provided re cervical precaution adherence during ADLs and functional mobility. Pt would benefit from continued OT services while here, with no f/u necessary upon d/c d/t family support.   Follow Up Recommendations  No OT follow up    Equipment Recommendations  None recommended by OT    Recommendations for Other Services PT consult     Precautions / Restrictions Precautions Precautions: Cervical Precaution Booklet Issued: Yes (comment) Precaution Comments: verbally reviewed cervical precaution handout Required Braces or Orthoses: (None required, pt prefers comfort soft brace) Restrictions Weight Bearing Restrictions: No      Mobility Bed Mobility Overal bed mobility: Needs Assistance Bed Mobility: Rolling;Sidelying to Sit Rolling: Supervision Sidelying to sit: Supervision       General bed mobility comments: vc for cervical precaution adherence  Transfers Overall transfer level: Needs assistance Equipment used: 1 person hand held assist Transfers: Sit to/from Stand;Stand Pivot Transfers Sit to Stand: Supervision Stand pivot transfers: Min guard       General transfer comment: pt with reduced balace d/t dizziness    Balance Overall balance assessment: Mild deficits observed, not formally tested                                         ADL either performed or assessed with clinical judgement   ADL Overall ADL's : Needs assistance/impaired Eating/Feeding: Modified independent;Sitting   Grooming: Wash/dry  hands;Sitting;Modified independent   Upper Body Bathing: Sitting;Set up   Lower Body Bathing: Set up;Sit to/from stand;Adhering to back precautions   Upper Body Dressing : Supervision/safety;Sitting;Cueing for compensatory techniques   Lower Body Dressing: Sit to/from stand;Set up;Cueing for back precautions   Toilet Transfer: Min guard   Toileting- Clothing Manipulation and Hygiene: Supervision/safety;Sit to/from stand   Tub/ Shower Transfer: Min guard;Tub transfer;Shower seat   Functional mobility during ADLs: Min guard;Cueing for safety General ADL Comments: pt limited by dizziness, RN reports this is d/t medication, all VSS     Vision Baseline Vision/History: Wears glasses Wears Glasses: At all times Patient Visual Report: No change from baseline Vision Assessment?: No apparent visual deficits        Praxis Praxis Praxis tested?: Within functional limits    Pertinent Vitals/Pain Pain Assessment: Faces Faces Pain Scale: Hurts little more Pain Location: cervical incision Pain Descriptors / Indicators: Sore Pain Intervention(s): Monitored during session     Hand Dominance Right   Extremity/Trunk Assessment Upper Extremity Assessment Upper Extremity Assessment: Generalized weakness;RUE deficits/detail RUE Deficits / Details: Residual weakness from C spine RUE Coordination: decreased fine motor   Lower Extremity Assessment Lower Extremity Assessment: Defer to PT evaluation   Cervical / Trunk Assessment Cervical / Trunk Assessment: Other exceptions Cervical / Trunk Exceptions: cervical spine sx   Communication Communication Communication: No difficulties   Cognition Arousal/Alertness: Awake/alert Behavior During Therapy: WFL for tasks assessed/performed Overall Cognitive Status: Within Functional Limits for tasks assessed  Home Living Family/patient expects to be discharged to:: Private  residence Living Arrangements: Spouse/significant other Available Help at Discharge: Family Type of Home: House Home Access: Stairs to enter Secretary/administratorntrance Stairs-Number of Steps: 2   Home Layout: One level     Bathroom Shower/Tub: Chief Strategy OfficerTub/shower unit   Bathroom Toilet: Handicapped height     Home Equipment: Shower seat          Prior Functioning/Environment Level of Independence: Independent        Comments: Works at [employer]        OT Problem List: Impaired balance (sitting and/or standing);Decreased activity tolerance;Decreased knowledge of precautions;Decreased coordination      OT Treatment/Interventions: Self-care/ADL training;Therapeutic exercise;Energy conservation;DME and/or AE instruction;Therapeutic activities;Patient/family education;Balance training    OT Goals(Current goals can be found in the care plan section) Acute Rehab OT Goals Patient Stated Goal: none stated OT Goal Formulation: With patient Time For Goal Achievement: 01/19/18 Potential to Achieve Goals: Good  OT Frequency: Min 2X/week    AM-PAC PT "6 Clicks" Daily Activity     Outcome Measure Help from another person eating meals?: None Help from another person taking care of personal grooming?: None Help from another person toileting, which includes using toliet, bedpan, or urinal?: A Little Help from another person bathing (including washing, rinsing, drying)?: A Little Help from another person to put on and taking off regular upper body clothing?: A Little Help from another person to put on and taking off regular lower body clothing?: A Little 6 Click Score: 20   End of Session Equipment Utilized During Treatment: Gait belt Nurse Communication: Mobility status  Activity Tolerance: Patient tolerated treatment well Patient left: in bed(with PT)  OT Visit Diagnosis: Unsteadiness on feet (R26.81)                Time: [id number] OT Time Calculation (min): 22 min Charges:  OT General Charges $OT  Visit: 1 Visit OT Evaluation $OT Eval Low Complexity: 1 Low Crissie ReeseSandra H Romy Ipock OTR/L 01/09/2018, 4:29 PM

## 2018-01-09 NOTE — Evaluation (Signed)
Physical Therapy Evaluation Patient Details Name: Kathy Avila MRN: [id number] DOB: [birth year] Today's Date: 01/09/2018   History of Present Illness  Kathy Avila is a 57yo F admitted with Cervical neck pain with herniated nucleus pulposus/ spondylosis/ stenosis at C3-7 and now s/p ACDF with plate G6-4 Q0-3 C5-6 C6-7.       Clinical Impression  Patient is s/p above surgery resulting in functional limitations due to the deficits listed below (see PT Problem List). PTA, pt independent. Pt limited this eval by dizziness, VSS on RA. Ambulating with hand held assist with several LOB. anticipate patient will do well tomorrow with therapy and progress to stairs.  Patient will benefit from skilled PT to increase their independence and safety with mobility to allow discharge to the venue listed below.       Follow Up Recommendations Supervision for mobility/OOB    Equipment Recommendations  Rolling walker with 5" wheels    Recommendations for Other Services       Precautions / Restrictions Precautions Precautions: Cervical Precaution Booklet Issued: Yes (comment) Precaution Comments: verbally reviewed cervical precaution handout Required Braces or Orthoses: (None required, pt prefers comfort soft brace) Restrictions Weight Bearing Restrictions: No      Mobility  Bed Mobility Overal bed mobility: Needs Assistance Bed Mobility: Rolling;Sidelying to Sit Rolling: Supervision Sidelying to sit: Supervision       General bed mobility comments: vc for cervical precaution adherence  Transfers Overall transfer level: Needs assistance Equipment used: 1 person hand held assist Transfers: Sit to/from Stand;Stand Pivot Transfers Sit to Stand: Supervision Stand pivot transfers: Min guard       General transfer comment: pt with reduced balace d/t dizziness  Ambulation/Gait Ambulation/Gait assistance: Min guard;Min assist Gait Distance (Feet): 40 Feet Assistive device: 1 person  hand held assist Gait Pattern/deviations: Step-to pattern;Step-through pattern Gait velocity: decrease   General Gait Details: pt very unsteady with dizziness and balance. VSS, RN aware. hand held assist and use of railing in hallway, limited due to several LOB. will progress next visit.   Stairs            Wheelchair Mobility    Modified Rankin (Stroke Patients Only)       Balance Overall balance assessment: Mild deficits observed, not formally tested                                           Pertinent Vitals/Pain Pain Assessment: Faces Faces Pain Scale: Hurts little more Pain Location: cervical incision Pain Descriptors / Indicators: Sore Pain Intervention(s): Limited activity within patient's tolerance;Monitored during session;Premedicated before session    Home Living Family/patient expects to be discharged to:: Private residence Living Arrangements: Spouse/significant other Available Help at Discharge: Family Type of Home: House Home Access: Stairs to enter   Secretary/administrator of Steps: 2 Home Layout: One level Home Equipment: Shower seat      Prior Function Level of Independence: Independent         Comments: Works at [employer]   Dominant Hand: Right    Extremity/Trunk Assessment   Upper Extremity Assessment Upper Extremity Assessment: Defer to OT evaluation RUE Deficits / Details: Residual weakness from C spine RUE Coordination: decreased fine motor    Lower Extremity Assessment Lower Extremity Assessment: Overall WFL for tasks assessed    Cervical / Trunk Assessment Cervical / Trunk Assessment:  Other exceptions Cervical / Trunk Exceptions: cervical spine sx  Communication   Communication: No difficulties  Cognition Arousal/Alertness: Awake/alert Behavior During Therapy: WFL for tasks assessed/performed Overall Cognitive Status: Within Functional Limits for tasks assessed                                         General Comments      Exercises     Assessment/Plan    PT Assessment Patient needs continued PT services  PT Problem List Decreased range of motion;Decreased activity tolerance;Decreased balance       PT Treatment Interventions Gait training;Stair training;Functional mobility training    PT Goals (Current goals can be found in the Care Plan section)  Acute Rehab PT Goals Patient Stated Goal: none stated PT Goal Formulation: With patient Potential to Achieve Goals: Good    Frequency Min 5X/week   Barriers to discharge        Co-evaluation               AM-PAC PT "6 Clicks" Daily Activity  Outcome Measure Difficulty turning over in bed (including adjusting bedclothes, sheets and blankets)?: A Little Difficulty moving from lying on back to sitting on the side of the bed? : A Little Difficulty sitting down on and standing up from a chair with arms (e.g., wheelchair, bedside commode, etc,.)?: A Little Help needed moving to and from a bed to chair (including a wheelchair)?: A Little Help needed walking in hospital room?: A Little Help needed climbing 3-5 steps with a railing? : A Little 6 Click Score: 18    End of Session Equipment Utilized During Treatment: Gait belt Activity Tolerance: Patient tolerated treatment well Patient left: in bed Nurse Communication: Mobility status PT Visit Diagnosis: Unsteadiness on feet (R26.81)    Time: [id number] PT Time Calculation (min) (ACUTE ONLY): 25 min   Charges:   PT Evaluation $PT Eval Low Complexity: 1 Low PT Treatments $Gait Training: 8-22 mins        Kathy Avila, PT, DPT Acute Rehab Services Pager: [phone]    Kathy Avila 01/09/2018, 5:29 PM

## 2018-01-09 NOTE — Transfer of Care (Signed)
Immediate Anesthesia Transfer of Care Note  Patient: Kathy Avila  Procedure(s) Performed: CERVICAL THREE-FOUR, CERVICAL FOUR-FIVE, CERVICAL FIVE-SIX, CERVICAL SIX-SEVEN ANTERIOR CERVICAL DECOMPRESSION/DISCECTOMY FUSION (N/A Spine Cervical)  Patient Location: PACU  Anesthesia Type:General  Level of Consciousness: awake, alert , oriented and patient cooperative  Airway & Oxygen Therapy: Patient Spontanous Breathing and Patient connected to nasal cannula oxygen  Post-op Assessment: Report given to RN, Post -op Vital signs reviewed and stable and Patient moving all extremities X 4  Post vital signs: Reviewed and stable  Last Vitals:  Vitals Value Taken Time  BP    Temp    Pulse 87 01/09/2018 12:03 PM  Resp 20 01/09/2018 12:03 PM  SpO2 96 % 01/09/2018 12:03 PM  Vitals shown include unvalidated device data.  Last Pain:  Vitals:   01/09/18 0626  TempSrc:   PainSc: 8       Patients Stated Pain Goal: 0 (01/09/18 0626)  Complications: No apparent anesthesia complications

## 2018-01-10 MED ORDER — OXYCODONE HCL 5 MG PO TABS: 5.0000 mg | ORAL_TABLET | ORAL | 0 refills | Status: DC | PRN

## 2018-01-10 MED ORDER — METHOCARBAMOL 500 MG PO TABS: 500.0000 mg | ORAL_TABLET | Freq: Four times a day (QID) | ORAL | 1 refills | Status: DC | PRN

## 2018-01-10 NOTE — Progress Notes (Signed)
Physical Therapy Treatment Patient Details Name: Kathy Avila MRN: [id number] DOB: 06/14/1960 Today's Date: 01/10/2018    History of Present Illness Kathy Avila is a 57yo F admitted with Cervical neck pain with herniated nucleus pulposus/ spondylosis/ stenosis at C3-7 and now s/p ACDF with plate N8-2C3-4 N5-6C4-5 C5-6 C6-7.     PT Comments    Patient seen for mobility progression s/p spinal surgery. Mobilizing well. Educated patient on precautions, mobility expectations, safety and car transfers. Performed stair negotiation without difficulty. No further acute PT needs. Will sign off.    Follow Up Recommendations  Supervision for mobility/OOB     Equipment Recommendations  None recommended by PT    Recommendations for Other Services       Precautions / Restrictions Precautions Precautions: Cervical Precaution Booklet Issued: Yes (comment) Precaution Comments: verbally reviewed cervical precaution handout Restrictions Weight Bearing Restrictions: No    Mobility  Bed Mobility Overal bed mobility: Needs Assistance Bed Mobility: Rolling;Sidelying to Sit Rolling: Supervision Sidelying to sit: Supervision       General bed mobility comments: vc for cervical precaution adherence  Transfers Overall transfer level: Needs assistance Equipment used: None Transfers: Sit to/from Stand;Stand Pivot Transfers Sit to Stand: Supervision         General transfer comment: supervision for safety, no physical assist required  Ambulation/Gait Ambulation/Gait assistance: Supervision Gait Distance (Feet): 210 Feet Assistive device: None Gait Pattern/deviations: Step-to pattern;Step-through pattern     General Gait Details: improved stability   Stairs Stairs: Yes Stairs assistance: Supervision Stair Management: One rail Right Number of Stairs: 6 General stair comments: VCs for technique   Wheelchair Mobility    Modified Rankin (Stroke Patients Only)       Balance  Overall balance assessment: Mild deficits observed, not formally tested                                          Cognition Arousal/Alertness: Awake/alert Behavior During Therapy: WFL for tasks assessed/performed Overall Cognitive Status: Within Functional Limits for tasks assessed                                        Exercises      General Comments        Pertinent Vitals/Pain Pain Assessment: Faces Faces Pain Scale: Hurts little more Pain Location: cervical incision Pain Descriptors / Indicators: Sore Pain Intervention(s): Monitored during session    Home Living                      Prior Function            PT Goals (current goals can now be found in the care plan section) Acute Rehab PT Goals Patient Stated Goal: none stated PT Goal Formulation: With patient Potential to Achieve Goals: Good Progress towards PT goals: Progressing toward goals    Frequency    Min 5X/week      PT Plan Current plan remains appropriate    Co-evaluation              AM-PAC PT "6 Clicks" Daily Activity  Outcome Measure  Difficulty turning over in bed (including adjusting bedclothes, sheets and blankets)?: A Little Difficulty moving from lying on back to sitting on the side of the bed? : A  Little Difficulty sitting down on and standing up from a chair with arms (e.g., wheelchair, bedside commode, etc,.)?: A Little Help needed moving to and from a bed to chair (including a wheelchair)?: A Little Help needed walking in hospital room?: A Little Help needed climbing 3-5 steps with a railing? : A Little 6 Click Score: 18    End of Session Equipment Utilized During Treatment: Gait belt Activity Tolerance: Patient tolerated treatment well Patient left: in bed(sitting EOB) Nurse Communication: Mobility status PT Visit Diagnosis: Unsteadiness on feet (R26.81)     Time: 0703-0720 PT Time Calculation (min) (ACUTE ONLY): 17  min  Charges:  $Gait Training: 8-22 mins                     Charlotte Crumb, PT DPT  Board Certified Neurologic Specialist [phone]    Fabio Asa 01/10/2018, 7:42 AM

## 2018-01-10 NOTE — Progress Notes (Signed)
Subjective: Patient reports doing well.  Right arm pain is improved.  Objective: Vital signs in last 24 hours: Temp:  [97.3 F (36.3 C)-98.5 F (36.9 C)] 98.5 F (36.9 C) (08/31 0722) Pulse Rate:  [62-86] 71 (08/31 0722) Resp:  [11-20] 16 (08/31 0722) BP: (110-142)/(44-94) 129/76 (08/31 0722) SpO2:  [94 %-99 %] 99 % (08/31 0722)  Intake/Output from previous day: 08/30 0701 - 08/31 0700 In: 2660 [P.O.:1160; I.V.:1500] Out: 1195 [Urine:650; Drains:145; Blood:400] Intake/Output this shift: No intake/output data recorded.  Physical Exam: Strength full bilateral D/B/T/HI.  MAEW.  Dressing CDI.  No c/o dysphagia.  Lab Results: No results for input(s): WBC, HGB, HCT, PLT in the last 72 hours. BMET No results for input(s): NA, K, CL, CO2, GLUCOSE, BUN, CREATININE, CALCIUM in the last 72 hours.  Studies/Results: Dg Cervical Spine 1 View  Result Date: 01/09/2018 CLINICAL DATA:  Cervical fusion EXAM: DG C-ARM 61-120 MIN; DG CERVICAL SPINE - 1 VIEW COMPARISON:  None. FLUOROSCOPY TIME:  Radiation Exposure Index (as provided by the fluoroscopic device): 3.24 mGy If the device does not provide the exposure index: Fluoroscopy Time:  40 seconds Number of Acquired Images:  2 FINDINGS: Initial spot film demonstrates a curved instrument in the anterior aspect of the C3-4 disc space. Subsequent images demonstrate interbody fusion at C3-4, C4-5, C5-6 and C6-7 with anterior fixation. IMPRESSION: Cervical fusion from C3-C7. Electronically Signed   By: Alcide CleverMark  Lukens M.D.   On: 01/09/2018 13:07   Dg C-arm 1-60 Min  Result Date: 01/09/2018 CLINICAL DATA:  Cervical fusion EXAM: DG C-ARM 61-120 MIN; DG CERVICAL SPINE - 1 VIEW COMPARISON:  None. FLUOROSCOPY TIME:  Radiation Exposure Index (as provided by the fluoroscopic device): 3.24 mGy If the device does not provide the exposure index: Fluoroscopy Time:  40 seconds Number of Acquired Images:  2 FINDINGS: Initial spot film demonstrates a curved instrument in  the anterior aspect of the C3-4 disc space. Subsequent images demonstrate interbody fusion at C3-4, C4-5, C5-6 and C6-7 with anterior fixation. IMPRESSION: Cervical fusion from C3-C7. Electronically Signed   By: Alcide CleverMark  Lukens M.D.   On: 01/09/2018 13:07    Assessment/Plan: Patient is doing well.  Discharge home.      LOS: 1 day    Dorian HeckleSTERN,Kathy Avila D, MD 01/10/2018, 8:57 AM

## 2018-01-10 NOTE — Discharge Summary (Signed)
Physician Discharge Summary  Patient ID: Kathy Avila MRN: [id number] DOB/AGE: 59/10/1960 59 y.o.  Admit date: 01/09/2018 Discharge date: 01/10/2018  Admission Diagnoses:cervical spondylosis, degenerative disc disease C3-4 C4-5 C5-6 and C6-7, cervical spinal stenosis, neck and right arm pain   Discharge Diagnoses: cervical spondylosis, degenerative disc disease C3-4 C4-5 C5-6 and C6-7, cervical spinal stenosis, neck and right arm pain  Active Problems:   Cervical vertebral fusion   Discharged Condition: good  Hospital Course: Patient underwent: 1. Decompressive anterior cervical discectomy C3-4 C4-5 C5-6 and C6-7, 2. Anterior cervical arthrodesis C3-4 C4-5 and C5-6 and C6-7 utilizing a porous titanium interbody cage packed with locally harvested morcellized autologous bone graft, 3. Anterior cervical plating C3-C7 inclusive utilizing a Medtronic atlantis vision plate  She did well and was discharged home on POD 1.  Consults: None  Significant Diagnostic Studies: None  Treatments: surgery: 1. Decompressive anterior cervical discectomy C3-4 C4-5 C5-6 and C6-7, 2. Anterior cervical arthrodesis C3-4 C4-5 and C5-6 and C6-7 utilizing a porous titanium interbody cage packed with locally harvested morcellized autologous bone graft, 3. Anterior cervical plating C3-C7 inclusive utilizing a Medtronic atlantis vision plate  Discharge Exam: Blood pressure 129/76, pulse 71, temperature 98.5 F (36.9 C), temperature source Oral, resp. rate 16, SpO2 99 %. Neurologic: Alert and oriented X 3, normal strength and tone. Normal symmetric reflexes. Normal coordination and gait Wound: CDI  Disposition: Home  Discharge Instructions    Diet - low sodium heart healthy   Complete by:  As directed    Increase activity slowly   Complete by:  As directed      Allergies as of 01/10/2018   No Known Allergies     Medication List    TAKE these medications   buPROPion 300 MG 24 hr tablet Commonly  known as:  WELLBUTRIN XL TAKE 1 TABLET BY MOUTH EVERY DAY   cholecalciferol 1000 units tablet Commonly known as:  VITAMIN D Take 1,000 Units by mouth daily.   estradiol 0.1 MG/24HR patch Commonly known as:  VIVELLE-DOT Place 1 patch onto the skin every 3 (three) days.   gabapentin 100 MG capsule Commonly known as:  NEURONTIN Take 3-4 capsules (300-400 mg total) by mouth 3 (three) times daily.   ibuprofen 200 MG tablet Commonly known as:  ADVIL,MOTRIN Take 400-600 mg by mouth every 6 (six) hours as needed for mild pain.   MAGNESIUM CITRATE PO Take 1 tablet by mouth daily.   methocarbamol 500 MG tablet Commonly known as:  ROBAXIN Take 1 tablet (500 mg total) by mouth every 6 (six) hours as needed for muscle spasms.   multivitamin with minerals Tabs tablet Take 1 tablet by mouth daily.   oxyCODONE 5 MG immediate release tablet Commonly known as:  Oxy IR/ROXICODONE Take 1-2 tablets (5-10 mg total) by mouth every 3 (three) hours as needed for severe pain ((score 4 to 6)).   progesterone 200 MG capsule Commonly known as:  PROMETRIUM Take 200 mg by mouth at bedtime.   propranolol 10 MG tablet Commonly known as:  INDERAL Take 10 mg by mouth 2 (two) times daily.   ranitidine 150 MG capsule Commonly known as:  ZANTAC Take 150 mg by mouth 2 (two) times daily as needed for heartburn.   traMADol 50 MG tablet Commonly known as:  ULTRAM Take 50 mg by mouth every 12 (twelve) hours as needed for moderate pain.   vitamin E 400 UNIT capsule Take 400 Units by mouth daily.  Signed: Dorian Heckle, MD 01/10/2018, 8:58 AM

## 2018-01-10 NOTE — Discharge Instructions (Signed)
Wound Care Keep incision covered and dry for 2 days    You may remove outer bandage after 2 days. Do not put any creams, lotions, or ointments on incision. Leave steri-strips on neck.  They will fall off by themselves. Activity Walk each and every day, increasing distance each day. No lifting greater than 8 lbs.  Avoid excessive neck motion. No driving for 2 weeks; may ride as a passenger locally.  Diet Resume your normal soft diet.  Return to Work Will be discussed at you follow up appointment. Call Your Doctor If Any of These Occur Redness, drainage, or swelling at the wound.  Temperature greater than 101 degrees. Severe pain not relieved by pain medication. Increased difficulty swallowing.  Incision starts to come apart. Follow Up Appt Call today for appointment in 2 weeks [phone]) or for problems.  If you have any hardware placed in your spine, you will need an x-ray before your appointment.

## 2018-01-10 NOTE — Progress Notes (Signed)
Patient alert and oriented, mae's well, voiding adequate amount of urine, swallowing without difficulty, no c/o pain at time of discharge. Patient discharged home with family. Script and discharged instructions given to patient. Patient and family stated understanding of instructions given. Patient has an appointment with Dr. Jones °

## 2018-01-10 NOTE — Progress Notes (Signed)
Occupational Therapy Treatment Patient Details Name: Kathy Avila MRN: [id number] DOB: 17-Feb-1961 Today's Date: 01/10/2018    History of present illness Kathy Avila is a 57yo F admitted with Cervical neck pain with herniated nucleus pulposus/ spondylosis/ stenosis at C3-7 and now s/p ACDF with plate C3-4 B3-4 L9-3 C6-7.    OT comments  Pt progressing towards established OT goals. Pt performing ADLs and functional mobility at supervision level. Reviewed all cervical precautions and compensatory techniques for ADLs. Providing education on LB ADLs, tub transfer, and collar management. Pt verbalized understanding. Pt planning for dc today. All acute OT needs met and questions answered. Continue to recommend dc home once medically stable. Will sign off.    Follow Up Recommendations  No OT follow up    Equipment Recommendations  None recommended by OT    Recommendations for Other Services PT consult    Precautions / Restrictions Precautions Precautions: Cervical Precaution Booklet Issued: Yes (comment) Precaution Comments: verbally reviewed cervical precaution handout Required Braces or Orthoses: (None required, pt prefers comfort soft brace) Restrictions Weight Bearing Restrictions: No       Mobility Bed Mobility Overal bed mobility: Needs Assistance Bed Mobility: Rolling;Sidelying to Sit Rolling: Supervision Sidelying to sit: Supervision       General bed mobility comments: vc for cervical precaution adherence  Transfers Overall transfer level: Needs assistance Equipment used: None Transfers: Sit to/from Stand;Stand Pivot Transfers Sit to Stand: Supervision         General transfer comment: supervision for safety, no physical assist required    Balance Overall balance assessment: Mild deficits observed, not formally tested                                         ADL either performed or assessed with clinical judgement   ADL Overall ADL's  : Needs assistance/impaired         Upper Body Bathing: Supervision/ safety;Sitting Upper Body Bathing Details (indicate cue type and reason): Educating pt on UB bathing. Pt with questions about washing hair. Educating her on compensatory tehcniques for washing her hair and husband stating he will asisst as well at home.      Upper Body Dressing : Supervision/safety;Sitting;Cueing for compensatory techniques Upper Body Dressing Details (indicate cue type and reason): Reviewed UB dressing techniques. Pt planning to wear button ups. Pt verbalized understanding of donning shirt and adherance to cervical precautions Lower Body Dressing: Sit to/from stand;Set up;Cueing for back precautions Lower Body Dressing Details (indicate cue type and reason): Pt able to bring ankles to knees         Tub/ Shower Transfer: Tub transfer;Shower seat;Ambulation Tub/Shower Transfer Details (indicate cue type and reason): Educating pt on tub transfer   General ADL Comments: Reviewd all cervical precautions and compensatory techniques for ADLs     Vision   Vision Assessment?: No apparent visual deficits   Perception     Praxis      Cognition Arousal/Alertness: Awake/alert Behavior During Therapy: WFL for tasks assessed/performed Overall Cognitive Status: Within Functional Limits for tasks assessed                                          Exercises     Shoulder Instructions       General Comments Husband present  throuhgout session    Pertinent Vitals/ Pain       Pain Assessment: Faces Faces Pain Scale: Hurts little more Pain Location: cervical incision Pain Descriptors / Indicators: Sore Pain Intervention(s): Monitored during session  Home Living                                          Prior Functioning/Environment              Frequency  Min 2X/week        Progress Toward Goals  OT Goals(current goals can now be found in the care plan  section)  Progress towards OT goals: Goals met/education completed, patient discharged from OT  Acute Rehab OT Goals Patient Stated Goal: none stated OT Goal Formulation: With patient Time For Goal Achievement: 01/19/18 Potential to Achieve Goals: Good ADL Goals Pt Will Transfer to Toilet: with modified independence Pt Will Perform Toileting - Clothing Manipulation and hygiene: with modified independence Pt Will Perform Tub/Shower Transfer: with modified independence  Plan All goals met and education completed, patient discharged from OT services    Co-evaluation                 AM-PAC PT "6 Clicks" Daily Activity     Outcome Measure   Help from another person eating meals?: None Help from another person taking care of personal grooming?: None Help from another person toileting, which includes using toliet, bedpan, or urinal?: A Little Help from another person bathing (including washing, rinsing, drying)?: A Little Help from another person to put on and taking off regular upper body clothing?: A Little Help from another person to put on and taking off regular lower body clothing?: A Little 6 Click Score: 20    End of Session Equipment Utilized During Treatment: Gait belt  OT Visit Diagnosis: Unsteadiness on feet (R26.81)   Activity Tolerance Patient tolerated treatment well   Patient Left in bed(with PT)   Nurse Communication Mobility status        Time: 4403-4742 OT Time Calculation (min): 9 min  Charges: OT General Charges $OT Visit: 1 Visit OT Treatments $Self Care/Home Management : 8-22 mins  [place], OTR/L Acute Rehab Pager: [phone] Office: [place] 01/10/2018, 10:16 AM

## 2018-01-11 ENCOUNTER — Other Ambulatory Visit: Payer: Self-pay | Admitting: Family Medicine

## 2018-01-13 MED FILL — Thrombin (Recombinant) For Soln 5000 Unit: CUTANEOUS | Qty: 5000 | Status: AC

## 2018-01-13 MED FILL — Thrombin (Recombinant) For Soln 20000 Unit: CUTANEOUS | Qty: 1 | Status: AC

## 2018-01-13 NOTE — Anesthesia Postprocedure Evaluation (Addendum)
Anesthesia Post Note  Patient: Kathy Avila  Procedure(s) Performed: CERVICAL THREE-FOUR, CERVICAL FOUR-FIVE, CERVICAL FIVE-SIX, CERVICAL SIX-SEVEN ANTERIOR CERVICAL DECOMPRESSION/DISCECTOMY FUSION (N/A Spine Cervical)     Patient location during evaluation: PACU Anesthesia Type: General Level of consciousness: awake and alert Pain management: pain level controlled Vital Signs Assessment: post-procedure vital signs reviewed and stable Respiratory status: spontaneous breathing, nonlabored ventilation and respiratory function stable Cardiovascular status: blood pressure returned to baseline and stable Postop Assessment: no apparent nausea or vomiting Anesthetic complications: no    Last Vitals:  Vitals:   01/10/18 0330 01/10/18 0722  BP: (!) 142/78 129/76  Pulse: 64 71  Resp: 17 16  Temp: 36.5 C 36.9 C  SpO2: 98% 99%    Last Pain:  Vitals:   01/10/18 0722  TempSrc: Oral  PainSc:                  Lowella Curb

## 2018-01-13 NOTE — Telephone Encounter (Signed)
Electronic refill request Last office visit 10/17/17 Last refill 10/23/17 #30/5

## 2018-01-14 ENCOUNTER — Encounter (HOSPITAL_COMMUNITY): Payer: Self-pay | Admitting: Neurological Surgery

## 2018-01-14 MED ORDER — BUPROPION HCL ER (XL) 300 MG PO TB24: 300.0000 mg | ORAL_TABLET | Freq: Every day | ORAL | 1 refills | Status: DC

## 2018-01-14 NOTE — Telephone Encounter (Signed)
Sent. Thanks.   

## 2018-01-15 ENCOUNTER — Other Ambulatory Visit: Payer: Self-pay | Admitting: Family Medicine

## 2018-01-15 ENCOUNTER — Other Ambulatory Visit: Payer: Self-pay | Admitting: *Deleted

## 2018-01-15 ENCOUNTER — Ambulatory Visit: Payer: Self-pay | Admitting: *Deleted

## 2018-01-15 ENCOUNTER — Encounter: Payer: Self-pay | Admitting: Family Medicine

## 2018-01-15 NOTE — Telephone Encounter (Signed)
Faxed refill request. Estradiol Last office visit:   10/17/17 Last Filled:   ? Phoned patient and left message about current usage prior to asking for refill authorization.

## 2018-01-15 NOTE — Telephone Encounter (Signed)
Pt has appt with Dr Para March on 01/16/18 at 3 PM; per current and hx med list do not see where estradiol patch has been filled by Dr Para March. Please advise. I spoke with pt. Pt said has dull naggy pain in rt hand where IV was and pain goes up arm to elbow; occasionally has dull pain that goes from elbow to shoulder. no bruising but small knot at site of IV; pain goes from rt hand up arm. Heat seems to help. No redness on hand or arm and no streaking. Pt can use hand normally. Pt has appt to see surgeon on 01/26/18 but was advised by surgeon's office to see PCP. Offered appt to pt this morning at [facility] but pt said she cannot drive and has no way to come today. If pt condition changes or worsens will call [facility] during the day and if at night will go to ED. Pt also said that her OB GYN takes care of filling the estradiol so nothing needs to be done about refill of estradiol. FYI to Dr Para March.

## 2018-01-15 NOTE — Telephone Encounter (Signed)
Noted. Thanks.

## 2018-01-15 NOTE — Telephone Encounter (Signed)
This encounter was created in error - please disregard.

## 2018-01-15 NOTE — Telephone Encounter (Signed)
Pt called with complaints of pain in right arm from wrist to shoulder (worst from wrist to elbow); she says that she had surgery on 01/10/18 and her IV was there; the pt says that she has also applied heat to the area; she says she was instructed by the surgeon to see her PCP; recommendations made per nurse triage protocol to include seeing a physician within 24 hours; the pt requests to see Dr Para March after 3pm; pt offered and accepted appointment with Dr Para March, [facility], 01/16/18 at 1500; she verbalizes understanding; she also requests that a prescription for her estrogen patch be sent to [facility] for a 90 day supply; will route to office for notification of this upcoming appointment and pt request.   Reason for Disposition . [1] Pain at IV site or shooting up arm AND [2] NO redness or swelling AND [3] IV has been removed  Answer Assessment - Initial Assessment Questions 1. ONSET: "When did the pain start?"     01/10/18 2. LOCATION: "Where is the pain located?"     Right arm from wrist to shoulder 3. PAIN: "How bad is the pain?" (Scale 1-10; or mild, moderate, severe)   - MILD (1-3): doesn't interfere with normal activities   - MODERATE (4-7): interferes with normal activities (e.g., work or school) or awakens from sleep   - SEVERE (8-10): excruciating pain, unable to do any normal activities, unable to hold a cup of water     7 -8 out of 10 4. WORK OR EXERCISE: "Has there been any recent work or exercise that involved this part of the body?"     no 5. CAUSE: "What do you think is causing the arm pain?"     IV site from surgery on 01/10/18 6. OTHER SYMPTOMS: "Do you have any other symptoms?" (e.g., neck pain, swelling, rash, fever, numbness, weakness)     Can feel when she rotates right wrist, ? Low grade fever 7. PREGNANCY: "Is there any chance you are pregnant?" "When was your last menstrual period?"     no  Answer Assessment - Initial Assessment Questions 1. SYMPTOM:  "What's the main  symptom you're concerned about?" (e.g., pain, redness, swelling, pus)     pain 2. ONSET: "When did the  start?"     01/10/18 3. IV WHAT: "What kind of IV line do you have?" (e.g., central line, PICC, peripheral IV)     PIV 4. IV WHERE: "Where does the IV enter your body?"     Right hand 5. IV WHEN: "When was this IV put in?"     01/10/18 6. IV WHY: "Why do you have this IV line?"     Surgery on 01/10/18 7. IV RUNNING OK: "Is the IV running OK?" (e.g., running OK, running slowly, not running, unable to flush)      n/a 8. PAIN: "Is there any pain?" If so, ask: "How bad is the pain?"   (e.g., scale 1-10; or mild, moderate, severe)     7-8 out of 1- 9. OTHER SYMPTOMS: "Do you have any other symptoms?" (e.g., fever, shaking chills, new weakness, pus)     ? Low grade fever; pain when rotating wrist 10. VISITING NURSE: "Do you have a visiting nurse?" (e.g., home health nurse, IV infusion nurse)       no  Protocols used: IV SITE (SKIN) SYMPTOMS-A-AH, ARM PAIN-A-AH

## 2018-01-15 NOTE — Telephone Encounter (Signed)
Copied from CRM [id number]. Topic: Quick Communication - Rx Refill/Question >> Jan 15, 2018  9:06 AM Stephannie Li, NT wrote: Medication: estradiol (VIVELLE-DOT) 0.1 MG/24HR patch  Has the patient contacted their pharmacy? no (Agent: If no, request that the patient contact the pharmacy for the refill. (Agent: If yes, when and what did the pharmacy advise?  Preferred Pharmacy (with phone number or street name [facility] [place], [place] - [address] [phone] (Phone) [phone] (Fax)    Agent: Please be advised that RX refills may take up to 3 business days. We ask that you follow-up with your pharmacy.

## 2018-01-15 NOTE — Telephone Encounter (Signed)
Attempted to contact pt regarding refill request for estradiol patch; need to verify if Dr Para March was the ordering physician for this medication; see Dr Lianne Bushy office visit note dated 08/06/16 says "menopausal symptoms per outside clinic"; left message for pt to call office on voicemail [phone].

## 2018-01-15 NOTE — Telephone Encounter (Signed)
Contacted pt and she feels that she thinks that her GYN ordered that prescription; she will contact the GYN office; no refill needed from Dr Para March, [facility].

## 2018-01-16 ENCOUNTER — Ambulatory Visit (INDEPENDENT_AMBULATORY_CARE_PROVIDER_SITE_OTHER): Payer: 59 | Admitting: Internal Medicine

## 2018-01-16 ENCOUNTER — Encounter: Payer: Self-pay | Admitting: Internal Medicine

## 2018-01-16 ENCOUNTER — Ambulatory Visit: Payer: 59 | Admitting: Family Medicine

## 2018-01-16 VITALS — BP 128/84 | HR 71 | Temp 98.1°F | Wt 167.0 lb

## 2018-01-16 DIAGNOSIS — M79631 Pain in right forearm: Secondary | ICD-10-CM

## 2018-01-16 DIAGNOSIS — M79641 Pain in right hand: Secondary | ICD-10-CM

## 2018-01-16 MED ORDER — NAPROXEN 375 MG PO TABS: 375.0000 mg | ORAL_TABLET | Freq: Two times a day (BID) | ORAL | 0 refills | Status: DC

## 2018-01-16 NOTE — Progress Notes (Signed)
Subjective:    Patient ID: Kathy Avila, female    DOB: 02-18-1961, 57 y.o.   MRN: [id number]  HPI  Pt presents to the clinic today with c/o right hand pain. She reports this started 5 days ago. She reports she was in the hospital for elective surgery for cervical stenosis. She had an IV in her right hand that was pulled out 6 days. She describes the pain as dull and achy. The pain radiates up her forearm into the elbow. She denies numbness, tingling or weakness. She has not noticed any redness, swelling or bruising. She reports she had intermittent pain like this in this hand prior to surgery, but was told she needed to address the neck issues first. She denies any injury to the area. She has used a heating pad with some relief.  Review of Systems      Past Medical History:  Diagnosis Date  . Anxiety   . B12 deficiency   . Depression   . GERD (gastroesophageal reflux disease)   . Headache   . Menopausal symptoms   . PONV (postoperative nausea and vomiting)   . Wears contact lenses     Current Outpatient Medications  Medication Sig Dispense Refill  . buPROPion (WELLBUTRIN XL) 300 MG 24 hr tablet Take 1 tablet (300 mg total) by mouth daily. 90 tablet 1  . cholecalciferol (VITAMIN D) 1000 units tablet Take 1,000 Units by mouth daily.    Marland Kitchen estradiol (VIVELLE-DOT) 0.1 MG/24HR patch Place 1 patch onto the skin every 3 (three) days.     Marland Kitchen gabapentin (NEURONTIN) 100 MG capsule Take 3-4 capsules (300-400 mg total) by mouth 3 (three) times daily.    Marland Kitchen ibuprofen (ADVIL,MOTRIN) 200 MG tablet Take 400-600 mg by mouth every 6 (six) hours as needed for mild pain.    Marland Kitchen MAGNESIUM CITRATE PO Take 1 tablet by mouth daily.    . methocarbamol (ROBAXIN) 500 MG tablet Take 1 tablet (500 mg total) by mouth every 6 (six) hours as needed for muscle spasms. 60 tablet 1  . Multiple Vitamin (MULTIVITAMIN WITH MINERALS) TABS tablet Take 1 tablet by mouth daily.    Marland Kitchen oxyCODONE (OXY IR/ROXICODONE) 5 MG  immediate release tablet Take 1-2 tablets (5-10 mg total) by mouth every 3 (three) hours as needed for severe pain ((score 4 to 6)). 60 tablet 0  . progesterone (PROMETRIUM) 200 MG capsule Take 200 mg by mouth at bedtime.    . propranolol (INDERAL) 10 MG tablet Take 10 mg by mouth 2 (two) times daily.    . ranitidine (ZANTAC) 150 MG capsule Take 150 mg by mouth 2 (two) times daily as needed for heartburn.     . traMADol (ULTRAM) 50 MG tablet Take 50 mg by mouth every 12 (twelve) hours as needed for moderate pain.    . vitamin E 400 UNIT capsule Take 400 Units by mouth daily.    . naproxen (NAPROSYN) 375 MG tablet Take 1 tablet (375 mg total) by mouth 2 (two) times daily with a meal. 30 tablet 0   No current facility-administered medications for this visit.     No Known Allergies  Family History  Problem Relation Age of Onset  . Cirrhosis Mother        not from etoh per patient report  . Throat cancer Father   . Breast cancer Neg Hx   . Colon cancer Neg Hx     Social History   Socioeconomic History  .  Marital status: Divorced    Spouse name: Not on file  . Number of children: Not on file  . Years of education: Not on file  . Highest education level: Not on file  Occupational History  . Not on file  Social Needs  . Financial resource strain: Not on file  . Food insecurity:    Worry: Not on file    Inability: Not on file  . Transportation needs:    Medical: Not on file    Non-medical: Not on file  Tobacco Use  . Smoking status: Never Smoker  . Smokeless tobacco: Never Used  Substance and Sexual Activity  . Alcohol use: Yes    Alcohol/week: 1.0 standard drinks    Types: 1 Glasses of wine per week    Comment: occ/weekends  . Drug use: No  . Sexual activity: Not on file  Lifestyle  . Physical activity:    Days per week: Not on file    Minutes per session: Not on file  . Stress: Not on file  Relationships  . Social connections:    Talks on phone: Not on file    Gets  together: Not on file    Attends religious service: Not on file    Active member of club or organization: Not on file    Attends meetings of clubs or organizations: Not on file    Relationship status: Not on file  . Intimate partner violence:    Fear of current or ex partner: Not on file    Emotionally abused: Not on file    Physically abused: Not on file    Forced sexual activity: Not on file  Other Topics Concern  . Not on file  Social History Narrative   Divorced.   2 grown daughters. Both out of town.   Lives with her boyfriend, Phill Mutter   Works at [employer], Aeronautical engineer.      Constitutional: Denies fever, malaise, fatigue, headache or abrupt weight changes.  Musculoskeletal: Pt reports right hand pain. Denies decrease in range of motion, difficulty with gait, muscle pain or joint swelling.  Skin: Denies redness, rashes, lesions or ulcercations.  Neurological: Denies numbness, tingling, weakness of right upper extremity.   No other specific complaints in a complete review of systems (except as listed in HPI above).  Objective:   Physical Exam   BP 128/84   Pulse 71   Temp 98.1 F (36.7 C) (Oral)   Wt 167 lb (75.8 kg)   SpO2 98%   BMI 26.95 kg/m  Wt Readings from Last 3 Encounters:  01/16/18 167 lb (75.8 kg)  01/01/18 174 lb 12.8 oz (79.3 kg)  10/17/17 180 lb 4 oz (81.8 kg)    General: Appears her stated age, well developed, well nourished in NAD. Skin: Warm, dry and intact. No redness, warmth or bruising noted at site of IV insertion. Cardiovascular: Radial pulse 2+ bilaterally. Cap refill < 3 secs. Musculoskeletal: Normal flexion, extension and rotation of the right wrist. Normal flexion, extension and rotation of the right elbow. No joint swelling noted. No pain with palpation of the metacarpals, carpals, radius, ulna or elbow. Strength 5/5 BUE. Hand grips equal.  Neurological: Alert and oriented. Sensation intact to BUE.   BMET    Component  Value Date/Time   NA 139 01/01/2018 1452   NA 140 10/17/2017 1022   K 3.7 01/01/2018 1452   CL 105 01/01/2018 1452   CO2 27 01/01/2018 1452   GLUCOSE 61 (  L) 01/01/2018 1452   BUN 7 01/01/2018 1452   BUN 13 10/17/2017 1022   CREATININE 1.05 (H) 01/01/2018 1452   CREATININE 1.25 12/17/2017   CALCIUM 9.2 01/01/2018 1452   GFRNONAA 57 (L) 01/01/2018 1452   GFRAA >60 01/01/2018 1452    Lipid Panel     Component Value Date/Time   CHOL 150 12/17/2017   TRIG 99 12/17/2017   HDL 62 09/06/2015 0841   CHOLHDL 2.3 09/06/2015 0841   VLDL 17 09/06/2015 0841   LDLCALC 76 12/17/2017    CBC    Component Value Date/Time   WBC 9.2 01/01/2018 1452   RBC 5.92 (H) 01/01/2018 1452   HGB 16.1 (H) 01/01/2018 1452   HGB 15.8 12/13/2016 0925   HCT 51.8 (H) 01/01/2018 1452   HCT 46.5 12/13/2016 0925   PLT 244 01/01/2018 1452   PLT 271 12/13/2016 0925   MCV 87.5 01/01/2018 1452   MCV 83 12/13/2016 0925   MCH 27.2 01/01/2018 1452   MCHC 31.1 01/01/2018 1452   RDW 13.5 01/01/2018 1452   RDW 13.6 12/13/2016 0925   LYMPHSABS 2.8 01/01/2018 1452   LYMPHSABS 1.9 12/13/2016 0925   MONOABS 0.7 01/01/2018 1452   EOSABS 0.1 01/01/2018 1452   EOSABS 0.2 12/13/2016 0925   BASOSABS 0.1 01/01/2018 1452   BASOSABS 0.0 12/13/2016 0925    Hgb A1C No results found for: HGBA1C         Assessment & Plan:   Right Hand/Forearm Pain:  Exam benign Continue heating pad for comfort eRx for Naproxen 375 mg BID with food- avoid OTC NSAID's If worsens, follow up with PCP/surgeon  Return precautions discussed Nicki Reaper, NP

## 2018-01-16 NOTE — Patient Instructions (Signed)
Naproxen and naproxen sodium oral immediate-release tablets What is this medicine? NAPROXEN (na PROX en) is a non-steroidal anti-inflammatory drug (NSAID). It is used to reduce swelling and to treat pain. This medicine may be used for dental pain, headache, or painful monthly periods. It is also used for painful joint and muscular problems such as arthritis, tendinitis, bursitis, and gout. This medicine may be used for other purposes; ask your health care provider or pharmacist if you have questions. COMMON BRAND NAME(S): Aflaxen, Aleve, Aleve Arthritis, All Day Relief, Anaprox, Anaprox DS, Naprosyn, [facility] Naproxen Sodium What should I tell my health care provider before I take this medicine? They need to know if you have any of these conditions: -asthma -cigarette smoker -drink more than 3 alcohol containing drinks a day -heart disease or circulation problems such as heart failure or leg edema (fluid retention) -high blood pressure -kidney disease -liver disease -stomach bleeding or ulcers -an unusual or allergic reaction to naproxen, aspirin, other NSAIDs, other medicines, foods, dyes, or preservatives -pregnant or trying to get pregnant -breast-feeding How should I use this medicine? Take this medicine by mouth with a glass of water. Follow the directions on the prescription label. Take it with food if your stomach gets upset. Try to not lie down for at least 10 minutes after you take it. Take your medicine at regular intervals. Do not take your medicine more often than directed. Long-term, continuous use may increase the risk of heart attack or stroke. A special MedGuide will be given to you by the pharmacist with each prescription and refill. Be sure to read this information carefully each time. Talk to your pediatrician regarding the use of this medicine in children. Special care may be needed. Overdosage: If you think you have taken too much of this medicine contact a poison control  center or emergency room at once. NOTE: This medicine is only for you. Do not share this medicine with others. What if I miss a dose? If you miss a dose, take it as soon as you can. If it is almost time for your next dose, take only that dose. Do not take double or extra doses. What may interact with this medicine? -alcohol -aspirin -cidofovir -diuretics -lithium -methotrexate -other drugs for inflammation like ketorolac or prednisone -pemetrexed -probenecid -warfarin This list may not describe all possible interactions. Give your health care provider a list of all the medicines, herbs, non-prescription drugs, or dietary supplements you use. Also tell them if you smoke, drink alcohol, or use illegal drugs. Some items may interact with your medicine. What should I watch for while using this medicine? Tell your doctor or health care professional if your pain does not get better. Talk to your doctor before taking another medicine for pain. Do not treat yourself. This medicine does not prevent heart attack or stroke. In fact, this medicine may increase the chance of a heart attack or stroke. The chance may increase with longer use of this medicine and in people who have heart disease. If you take aspirin to prevent heart attack or stroke, talk with your doctor or health care professional. Do not take other medicines that contain aspirin, ibuprofen, or naproxen with this medicine. Side effects such as stomach upset, nausea, or ulcers may be more likely to occur. Many medicines available without a prescription should not be taken with this medicine. This medicine can cause ulcers and bleeding in the stomach and intestines at any time during treatment. Do not smoke cigarettes or drink   alcohol. These increase irritation to your stomach and can make it more susceptible to damage from this medicine. Ulcers and bleeding can happen without warning symptoms and can cause death. You may get drowsy or dizzy.  Do not drive, use machinery, or do anything that needs mental alertness until you know how this medicine affects you. Do not stand or sit up quickly, especially if you are an older patient. This reduces the risk of dizzy or fainting spells. This medicine can cause you to bleed more easily. Try to avoid damage to your teeth and gums when you brush or floss your teeth. What side effects may I notice from receiving this medicine? Side effects that you should report to your doctor or health care professional as soon as possible: -black or bloody stools, blood in the urine or vomit -blurred vision -chest pain -difficulty breathing or wheezing -nausea or vomiting -severe stomach pain -skin rash, skin redness, blistering or peeling skin, hives, or itching -slurred speech or weakness on one side of the body -swelling of eyelids, throat, lips -unexplained weight gain or swelling -unusually weak or tired -yellowing of eyes or skin Side effects that usually do not require medical attention (report to your doctor or health care professional if they continue or are bothersome): -constipation -headache -heartburn This list may not describe all possible side effects. Call your doctor for medical advice about side effects. You may report side effects to FDA at 1-800-FDA-1088. Where should I keep my medicine? Keep out of the reach of children. Store at room temperature between 15 and 30 degrees C (59 and 86 degrees F). Keep container tightly closed. Throw away any unused medicine after the expiration date. NOTE: This sheet is a summary. It may not cover all possible information. If you have questions about this medicine, talk to your doctor, pharmacist, or health care provider.  2018 Elsevier/Gold Standard (2009-05-01 20:10:16)  

## 2018-01-18 NOTE — Telephone Encounter (Signed)
Please get extra details about use, prev rx'ing MD (I don't see in EMR where I or any one at [facility] has rx'd this), and her plan to taper.  Thanks.   I didn't send rx yet.

## 2018-01-19 NOTE — Telephone Encounter (Signed)
Pt returning call stating that [facility] sent request for Estradiol refill to the wrong office. Pt states that this medication was previously filled by her OB-GYN, Dr. Chestine Spore. Pt states she will contact Dr. Chestine Spore for refill of medication.

## 2018-01-19 NOTE — Telephone Encounter (Signed)
Left message on voicemail for patient to call back. CRM done. 

## 2018-01-20 ENCOUNTER — Ambulatory Visit: Payer: 59 | Admitting: Family Medicine

## 2018-01-20 DIAGNOSIS — M47812 Spondylosis without myelopathy or radiculopathy, cervical region: Secondary | ICD-10-CM | POA: Diagnosis not present

## 2018-01-20 DIAGNOSIS — M5031 Other cervical disc degeneration,  high cervical region: Secondary | ICD-10-CM | POA: Diagnosis not present

## 2018-01-20 DIAGNOSIS — M4802 Spinal stenosis, cervical region: Secondary | ICD-10-CM | POA: Diagnosis not present

## 2018-01-21 NOTE — Telephone Encounter (Signed)
Noted. Thanks.

## 2018-01-26 DIAGNOSIS — M542 Cervicalgia: Secondary | ICD-10-CM | POA: Diagnosis not present

## 2018-04-06 ENCOUNTER — Encounter: Payer: Self-pay | Admitting: Family Medicine

## 2018-04-06 ENCOUNTER — Ambulatory Visit (INDEPENDENT_AMBULATORY_CARE_PROVIDER_SITE_OTHER): Payer: 59 | Admitting: Family Medicine

## 2018-04-06 VITALS — BP 140/70 | HR 89 | Temp 98.9°F | Ht 66.0 in | Wt 173.5 lb

## 2018-04-06 DIAGNOSIS — F419 Anxiety disorder, unspecified: Secondary | ICD-10-CM

## 2018-04-06 DIAGNOSIS — Z23 Encounter for immunization: Secondary | ICD-10-CM

## 2018-04-06 MED ORDER — NAPROXEN 375 MG PO TABS: 375.0000 mg | ORAL_TABLET | Freq: Two times a day (BID) | ORAL | Status: DC

## 2018-04-06 MED ORDER — BUPROPION HCL ER (XL) 150 MG PO TB24: 150.0000 mg | ORAL_TABLET | Freq: Every day | ORAL | 3 refills | Status: DC

## 2018-04-06 NOTE — Progress Notes (Signed)
She is clearly better from her neck surgery, with clearly less pain now.   D/w pt about trial of prn pepcid since she is off zantac.    Mood. She has been on wellbutrin for an extended period of time.  Wellbutrin had prev helped, but the effect has been blunted more recently.  No SI/HI.  She has been irritable, more tearful.  Still on wellbutrin, compliant. She has been more anxious.  Sx predate he recent surgery.  Safe at home.  Minimal etoh, no illicits.    She was prev on effexor.    She'll update me if/when she needs a referral for a colonoscopy.  D/w pt.    Meds, vitals, and allergies reviewed.   ROS: Per HPI unless specifically indicated in ROS section   GEN: nad, alert and oriented HEENT: mucous membranes moist NECK: supple w/o LA CV: rrr. PULM: ctab, no inc wob ABD: soft, +bs EXT: no edema Speech judgment and affect appear normal.

## 2018-04-06 NOTE — Patient Instructions (Addendum)
Try the higher dose of wellbutrin and update me as needed.   Flu shot today.  Let me know if/when you need a referral for a colonoscopy.   Take care.  Glad to see you.

## 2018-04-07 DIAGNOSIS — Z1231 Encounter for screening mammogram for malignant neoplasm of breast: Secondary | ICD-10-CM | POA: Diagnosis not present

## 2018-04-07 DIAGNOSIS — M542 Cervicalgia: Secondary | ICD-10-CM | POA: Diagnosis not present

## 2018-04-07 DIAGNOSIS — M25519 Pain in unspecified shoulder: Secondary | ICD-10-CM | POA: Insufficient documentation

## 2018-04-11 NOTE — Assessment & Plan Note (Signed)
She previously had some response to Wellbutrin.  Discussed options.  We know she is not allergic or intolerant of that medication.  She is not on maximum dose.  Reasonable to increase Wellbutrin to 450 mg a day.  She can take 150+300 = 450 mg.  Update me as needed.  No suicidal or homicidal intent.  Okay for outpatient follow-up.  She agrees with plan.  If this is not helpful then we can consider addition of SSRI in the future.

## 2018-04-21 DIAGNOSIS — M542 Cervicalgia: Secondary | ICD-10-CM | POA: Diagnosis not present

## 2018-04-21 DIAGNOSIS — M7918 Myalgia, other site: Secondary | ICD-10-CM | POA: Insufficient documentation

## 2018-04-21 DIAGNOSIS — M7541 Impingement syndrome of right shoulder: Secondary | ICD-10-CM | POA: Diagnosis not present

## 2018-04-21 DIAGNOSIS — M7540 Impingement syndrome of unspecified shoulder: Secondary | ICD-10-CM | POA: Insufficient documentation

## 2018-05-08 DIAGNOSIS — M5412 Radiculopathy, cervical region: Secondary | ICD-10-CM | POA: Diagnosis not present

## 2018-05-08 DIAGNOSIS — M2560 Stiffness of unspecified joint, not elsewhere classified: Secondary | ICD-10-CM | POA: Diagnosis not present

## 2018-05-08 DIAGNOSIS — M542 Cervicalgia: Secondary | ICD-10-CM | POA: Diagnosis not present

## 2018-05-12 ENCOUNTER — Other Ambulatory Visit: Payer: Self-pay | Admitting: Family Medicine

## 2018-05-12 DIAGNOSIS — R030 Elevated blood-pressure reading, without diagnosis of hypertension: Secondary | ICD-10-CM | POA: Diagnosis not present

## 2018-05-12 DIAGNOSIS — M5412 Radiculopathy, cervical region: Secondary | ICD-10-CM | POA: Diagnosis not present

## 2018-05-12 DIAGNOSIS — M542 Cervicalgia: Secondary | ICD-10-CM | POA: Diagnosis not present

## 2018-05-12 DIAGNOSIS — M7918 Myalgia, other site: Secondary | ICD-10-CM | POA: Diagnosis not present

## 2018-05-12 DIAGNOSIS — M7541 Impingement syndrome of right shoulder: Secondary | ICD-10-CM | POA: Diagnosis not present

## 2018-05-12 DIAGNOSIS — M2560 Stiffness of unspecified joint, not elsewhere classified: Secondary | ICD-10-CM | POA: Diagnosis not present

## 2018-05-13 DIAGNOSIS — R05 Cough: Secondary | ICD-10-CM | POA: Diagnosis not present

## 2018-05-13 DIAGNOSIS — J301 Allergic rhinitis due to pollen: Secondary | ICD-10-CM | POA: Diagnosis not present

## 2018-05-18 DIAGNOSIS — M2560 Stiffness of unspecified joint, not elsewhere classified: Secondary | ICD-10-CM | POA: Diagnosis not present

## 2018-05-18 DIAGNOSIS — M542 Cervicalgia: Secondary | ICD-10-CM | POA: Diagnosis not present

## 2018-05-18 DIAGNOSIS — M5412 Radiculopathy, cervical region: Secondary | ICD-10-CM | POA: Diagnosis not present

## 2018-05-20 DIAGNOSIS — M2560 Stiffness of unspecified joint, not elsewhere classified: Secondary | ICD-10-CM | POA: Diagnosis not present

## 2018-05-20 DIAGNOSIS — M5412 Radiculopathy, cervical region: Secondary | ICD-10-CM | POA: Diagnosis not present

## 2018-05-20 DIAGNOSIS — M542 Cervicalgia: Secondary | ICD-10-CM | POA: Diagnosis not present

## 2018-05-26 ENCOUNTER — Other Ambulatory Visit: Payer: Self-pay | Admitting: *Deleted

## 2018-05-26 MED ORDER — BUPROPION HCL ER (XL) 150 MG PO TB24: 150.0000 mg | ORAL_TABLET | Freq: Every day | ORAL | 1 refills | Status: DC

## 2018-06-01 DIAGNOSIS — Z6830 Body mass index (BMI) 30.0-30.9, adult: Secondary | ICD-10-CM | POA: Diagnosis not present

## 2018-06-01 DIAGNOSIS — M542 Cervicalgia: Secondary | ICD-10-CM | POA: Diagnosis not present

## 2018-06-01 DIAGNOSIS — R030 Elevated blood-pressure reading, without diagnosis of hypertension: Secondary | ICD-10-CM | POA: Diagnosis not present

## 2018-06-24 ENCOUNTER — Ambulatory Visit: Payer: 59 | Admitting: Family Medicine

## 2018-06-24 ENCOUNTER — Encounter: Payer: Self-pay | Admitting: Family Medicine

## 2018-06-24 VITALS — BP 142/78 | HR 102 | Temp 98.1°F | Ht 66.0 in | Wt 175.0 lb

## 2018-06-24 DIAGNOSIS — R05 Cough: Secondary | ICD-10-CM

## 2018-06-24 DIAGNOSIS — J209 Acute bronchitis, unspecified: Secondary | ICD-10-CM

## 2018-06-24 DIAGNOSIS — R059 Cough, unspecified: Secondary | ICD-10-CM

## 2018-06-24 MED ORDER — BENZONATATE 200 MG PO CAPS: 200.0000 mg | ORAL_CAPSULE | Freq: Three times a day (TID) | ORAL | 1 refills | Status: DC | PRN

## 2018-06-24 MED ORDER — PREDNISONE 10 MG PO TABS: ORAL_TABLET | ORAL | 0 refills | Status: DC

## 2018-06-24 MED ORDER — GUAIFENESIN-CODEINE 100-10 MG/5ML PO SYRP: 5.0000 mL | ORAL_SOLUTION | Freq: Three times a day (TID) | ORAL | 0 refills | Status: DC | PRN

## 2018-06-24 NOTE — Progress Notes (Signed)
Subjective:    Patient ID: Kathy Avila, female    DOB: Mar 27, 1961, 58 y.o.   MRN: [id number]  HPI  Here for uri symptoms with fever/body aches and light headed   Pulse Readings from Last 3 Encounters:  06/24/18 (!) 102  04/06/18 89  01/16/18 71   Temp 98.1 today  Pulse ox 97%  Started wed-achey and a little cough  Thursday - then worse after lunch /felt awful  Chills and temp 101 in the evening   She called the MD live line (for [employer] emp) They sent her inhaler  Also medicine  Told she had a bad cold  Also px doxycyline (? Why)   Had a flu shot this year  Flu test is neg today   Now Bad cough that makes her chest sore  Prod -just a little (feels a rattle)  Phlegm - yellow  Some wheezing - using the inhaler and it helps some   Throat is sore from coughing  Ears- fine  Nose- running   Last fever: -chills yesterday/did not take her temp  Body aches on and off and she is hot and cold occ light headed  Trying to work   Otc: robitussin DM      Patient Active Problem List   Diagnosis Date Noted  . Acute bronchitis 06/24/2018  . Cervical vertebral fusion 01/09/2018  . History of MMR vaccination 01/01/2018  . Paresthesia 10/19/2017  . Right lower quadrant abdominal pain 12/15/2016  . Sacro-iliac pain 10/15/2016  . Osteopenia 10/15/2016  . Tachycardia 08/07/2016  . Advance care planning 08/07/2016  . Menopausal symptoms   . GERD (gastroesophageal reflux disease)   . B12 deficiency   . Anxiety   . DEPRESSION 07/03/2007   Past Medical History:  Diagnosis Date  . Anxiety   . B12 deficiency   . Depression   . GERD (gastroesophageal reflux disease)   . Headache   . Menopausal symptoms   . PONV (postoperative nausea and vomiting)   . Wears contact lenses    Past Surgical History:  Procedure Laterality Date  . ANKLE RECONSTRUCTION Bilateral   . ANTERIOR CERVICAL DECOMPRESSION/DISCECTOMY FUSION 4 LEVELS N/A 01/09/2018   Procedure: CERVICAL  THREE-FOUR, CERVICAL FOUR-FIVE, CERVICAL FIVE-SIX, CERVICAL SIX-SEVEN ANTERIOR CERVICAL DECOMPRESSION/DISCECTOMY FUSION;  Surgeon: Eustace Moore, MD;  Location: [facility];  Service: Neurosurgery;  Laterality: N/A;  . CARDIAC CATHETERIZATION N/A 09/06/2015   Procedure: Left Heart Cath and Coronary Angiography;  Surgeon: Corey Skains, MD;  Location: [facility];  Service: Cardiovascular;  Laterality: N/A;  . DILATION AND CURETTAGE OF UTERUS    . TONSILLECTOMY     Social History   Tobacco Use  . Smoking status: Never Smoker  . Smokeless tobacco: Never Used  Substance Use Topics  . Alcohol use: Yes    Alcohol/week: 1.0 standard drinks    Types: 1 Glasses of wine per week    Comment: occ/weekends  . Drug use: No   Family History  Problem Relation Age of Onset  . Cirrhosis Mother        not from etoh per patient report  . Throat cancer Father   . Breast cancer Neg Hx   . Colon cancer Neg Hx    No Known Allergies Current Outpatient Medications on File Prior to Visit  Medication Sig Dispense Refill  . buPROPion (WELLBUTRIN XL) 150 MG 24 hr tablet Take 1 tablet (150 mg total) by mouth daily. Take with 356m to  make 473m per day 90 tablet 1  . buPROPion (WELLBUTRIN XL) 300 MG 24 hr tablet TAKE 1 TABLET BY MOUTH  DAILY 90 tablet 1  . cholecalciferol (VITAMIN D) 1000 units tablet Take 1,000 Units by mouth daily.    .Marland Kitchenestradiol (VIVELLE-DOT) 0.1 MG/24HR patch Place 1 patch onto the skin every 3 (three) days.     . famotidine (PEPCID) 20 MG tablet Take 20 mg by mouth 2 (two) times daily.    .Marland KitchenMAGNESIUM CITRATE PO Take 1 tablet by mouth daily.    . methocarbamol (ROBAXIN) 500 MG tablet Take 1 tablet (500 mg total) by mouth every 6 (six) hours as needed for muscle spasms. 60 tablet 1  . Multiple Vitamin (MULTIVITAMIN WITH MINERALS) TABS tablet Take 1 tablet by mouth daily.    . naproxen (NAPROSYN) 375 MG tablet Take 1 tablet (375 mg total) by mouth 2 (two) times daily with a meal.      . oxyCODONE (OXY IR/ROXICODONE) 5 MG immediate release tablet Take 1-2 tablets (5-10 mg total) by mouth every 3 (three) hours as needed for severe pain ((score 4 to 6)). 60 tablet 0  . progesterone (PROMETRIUM) 200 MG capsule Take 200 mg by mouth at bedtime.    . propranolol (INDERAL) 10 MG tablet Take 10 mg by mouth 2 (two) times daily.    . traMADol (ULTRAM) 50 MG tablet Take 50 mg by mouth every 12 (twelve) hours as needed for moderate pain.    . vitamin E 400 UNIT capsule Take 400 Units by mouth daily.     No current facility-administered medications on file prior to visit.     Review of Systems  Constitutional: Positive for appetite change, fatigue and fever.  HENT: Positive for congestion, postnasal drip, rhinorrhea, sinus pressure, sneezing, sore throat and voice change. Negative for ear pain.   Eyes: Negative for pain and discharge.  Respiratory: Positive for cough. Negative for shortness of breath, wheezing and stridor.   Cardiovascular: Negative for chest pain.  Gastrointestinal: Negative for diarrhea, nausea and vomiting.  Genitourinary: Negative for frequency, hematuria and urgency.  Musculoskeletal: Negative for arthralgias and myalgias.  Skin: Negative for rash.  Neurological: Positive for headaches. Negative for dizziness, weakness and light-headedness.  Psychiatric/Behavioral: Negative for confusion and dysphoric mood.       Objective:   Physical Exam Constitutional:      General: She is not in acute distress.    Appearance: Normal appearance. She is well-developed and normal weight. She is ill-appearing. She is not toxic-appearing or diaphoretic.  HENT:     Head: Normocephalic and atraumatic.     Comments: Nares are injected and congested      Right Ear: Tympanic membrane, ear canal and external ear normal.     Left Ear: Tympanic membrane, ear canal and external ear normal.     Nose: Congestion and rhinorrhea present.     Mouth/Throat:     Mouth: Mucous membranes  are moist.     Pharynx: Oropharynx is clear. No oropharyngeal exudate or posterior oropharyngeal erythema.     Comments: Clear pnd  Eyes:     General:        Right eye: No discharge.        Left eye: No discharge.     Conjunctiva/sclera: Conjunctivae normal.     Pupils: Pupils are equal, round, and reactive to light.  Neck:     Musculoskeletal: Normal range of motion and neck supple.  Cardiovascular:  Rate and Rhythm: Tachycardia present.     Heart sounds: Normal heart sounds.  Pulmonary:     Effort: Pulmonary effort is normal. No respiratory distress.     Breath sounds: Normal breath sounds. No stridor. No wheezing, rhonchi or rales.     Comments: Wheeze on forced exp Scattered rhonchi No prolonged exp phase Chest:     Chest wall: No tenderness.  Lymphadenopathy:     Cervical: No cervical adenopathy.  Skin:    General: Skin is warm and dry.     Capillary Refill: Capillary refill takes less than 2 seconds.     Findings: No rash.  Neurological:     Mental Status: She is alert.     Cranial Nerves: No cranial nerve deficit.  Psychiatric:        Mood and Affect: Mood normal.           Assessment & Plan:   Problem List Items Addressed This Visit      Respiratory   Acute bronchitis - Primary    S/p likely influenza  Has been tx with doxycycline (? Unsure what for) Disc symptomatic care - see instructions on AVS  Rest/fluids Prednisone taper Tessalon and guaif-codeine for cough with caution of sedation  No work until no longer having fevers Update if not starting to improve in a week or if worsening    Meds ordered this encounter  Medications  . predniSONE (DELTASONE) 10 MG tablet    Sig: Take 3 pills once daily by mouth for 3 days, then 2 pills once daily for 3 days, then 1 pill once daily for 3 days and then stop    Dispense:  18 tablet    Refill:  0  . guaiFENesin-codeine (ROBITUSSIN AC) 100-10 MG/5ML syrup    Sig: Take 5 mLs by mouth 3 (three) times daily  as needed for cough. Caution of sedation, do not take if working or driving    Dispense:  80 mL    Refill:  0  . benzonatate (TESSALON) 200 MG capsule    Sig: Take 1 capsule (200 mg total) by mouth 3 (three) times daily as needed. Do not bite pill    Dispense:  30 capsule    Refill:  1          Other Visit Diagnoses    Cough       Relevant Orders   POC Influenza A&B(BINAX/QUICKVUE)

## 2018-06-24 NOTE — Assessment & Plan Note (Signed)
S/p likely influenza  Has been tx with doxycycline (? Unsure what for) Disc symptomatic care - see instructions on AVS  Rest/fluids Prednisone taper Tessalon and guaif-codeine for cough with caution of sedation  No work until no longer having fevers Update if not starting to improve in a week or if worsening    Meds ordered this encounter  Medications  . predniSONE (DELTASONE) 10 MG tablet    Sig: Take 3 pills once daily by mouth for 3 days, then 2 pills once daily for 3 days, then 1 pill once daily for 3 days and then stop    Dispense:  18 tablet    Refill:  0  . guaiFENesin-codeine (ROBITUSSIN AC) 100-10 MG/5ML syrup    Sig: Take 5 mLs by mouth 3 (three) times daily as needed for cough. Caution of sedation, do not take if working or driving    Dispense:  80 mL    Refill:  0  . benzonatate (TESSALON) 200 MG capsule    Sig: Take 1 capsule (200 mg total) by mouth 3 (three) times daily as needed. Do not bite pill    Dispense:  30 capsule    Refill:  1

## 2018-06-24 NOTE — Patient Instructions (Addendum)
Check your temperature regularly  I do not want you to go to work with a fever   Let's take you out today and tomorrow   Take prednisone to help the inflammation in lungs This will make you hyper and hungry   Drink lots of fluids   Tessalon for cough  Cough syrup with codeine (when not working or driving) to help  For fever - tylenol or ibuprofen   Update if not starting to improve in a week or if worsening

## 2018-07-09 ENCOUNTER — Telehealth: Payer: Self-pay

## 2018-07-09 NOTE — Telephone Encounter (Signed)
Patient calls in to inform Dr. Para March that the increase on her Wellbutrin really has not helped.  She continues to have progression of symptoms and would like discuss possibly changing to a new medication all together.    Says she spoke with Dr. Para March at the last visit regarding her mood/anxiety that she wanted to try something new that started with a "T"???  Says it is a new medication that is out right now but she couldn't remember the name. But you wanted to try the increase first.    Last seen for anxiety on 04/07/19.    Please advise if you would consider a change or if she should be seen again in the office.  Was seen on 2/12 for an acute bronchitis visit.  Thanks

## 2018-07-10 NOTE — Telephone Encounter (Signed)
I think it would be better to get patient an office visit.  Please see when you can get her scheduled.  Thanks.

## 2018-07-10 NOTE — Telephone Encounter (Signed)
Kathy Avila, can you help me and get this pt an appointment? Thanks

## 2018-07-10 NOTE — Telephone Encounter (Signed)
Appointment 3/3 pt aware

## 2018-07-14 ENCOUNTER — Ambulatory Visit: Payer: 59 | Admitting: Family Medicine

## 2018-07-14 ENCOUNTER — Encounter: Payer: Self-pay | Admitting: Family Medicine

## 2018-07-14 DIAGNOSIS — N951 Menopausal and female climacteric states: Secondary | ICD-10-CM | POA: Diagnosis not present

## 2018-07-14 DIAGNOSIS — F419 Anxiety disorder, unspecified: Secondary | ICD-10-CM | POA: Diagnosis not present

## 2018-07-14 DIAGNOSIS — M4322 Fusion of spine, cervical region: Secondary | ICD-10-CM | POA: Diagnosis not present

## 2018-07-14 DIAGNOSIS — F339 Major depressive disorder, recurrent, unspecified: Secondary | ICD-10-CM | POA: Diagnosis not present

## 2018-07-14 MED ORDER — VENLAFAXINE HCL 37.5 MG PO TABS: ORAL_TABLET | ORAL | 3 refills | Status: DC

## 2018-07-14 MED ORDER — TRAMADOL HCL 50 MG PO TABS: 50.0000 mg | ORAL_TABLET | Freq: Two times a day (BID) | ORAL | 0 refills | Status: DC | PRN

## 2018-07-14 NOTE — Patient Instructions (Addendum)
Use tramadol only as needed.   Cut wellbutrin back to 300mg  a day.  Start taking effexor 37.5mg  a day.  After 4 days, then increase to 37.5mg  twice a day.  Effexor may also help with hot flashes.   Update me in about 2 weeks.  We may need to change the effexor or wellbutrin dose at that point.  Take care.  Glad to see you.

## 2018-07-14 NOTE — Progress Notes (Signed)
Her cough is slowly getting better, with a tickle in her throat that causes a cough.  Tessalon helps some.     She is using neck exercises from PT, with some relief of neck pain from tramadol.  No ADE on med.  Used prn.  She needed a refill on tramadol, done at office visit.  Mood.  She is still on wellbutin 450mg  a day.  Still irritable and anxious, depressed.  No SI/HI.  In distant past, wellbutrin helped.  More recent escalation of dose didn't help.  Compliant with medication.  No illicit use.  Minimal alcohol.  Safe at home.  She still has the need to belch, with relief from drinking carbonated soda.  She does not have exertional chest pain.  She still having trouble with hot flashes.  This is been an ongoing nuisance for her.  Meds, vitals, and allergies reviewed.   ROS: Per HPI unless specifically indicated in ROS section   GEN: nad, alert and oriented, affect appropriate.  Speech normal. HEENT: mucous membranes moist NECK: supple w/o LA CV: rrr PULM: ctab, no inc wob ABD: soft, +bs EXT: no edema SKIN: Well-perfused No tremor.

## 2018-07-15 NOTE — Assessment & Plan Note (Signed)
History of, and she is continuing her exercise routine from physical therapy.  Encouraged her to continue that.  She is used tramadol without adverse effect and with some relief, used as needed.  Refill done.

## 2018-07-15 NOTE — Assessment & Plan Note (Signed)
No suicidal or homicidal intent.  Still okay for outpatient follow-up.  Wellbutrin did help in the past.  She had an increase in symptoms and decreased effectiveness with Wellbutrin, even with dose escalation.  Discussed options.  Decrease Wellbutrin back to 300 mg a day.  Start venlafaxine 37.5 mg a day for 4 days and then increase to twice a day thereafter if tolerated.  I want her to update me.  See after visit summary.  She agrees with plan.  Routine cautions given.  >25 minutes spent in face to face time with patient, >50% spent in counselling or coordination of care.

## 2018-07-15 NOTE — Assessment & Plan Note (Signed)
See discussion regarding anxiety.

## 2018-07-15 NOTE — Assessment & Plan Note (Signed)
Venlafaxine may help.  Discussed with patient that this can be used off label with some affect on hot flashes.  Reasonable to try.  See above.

## 2018-07-23 ENCOUNTER — Telehealth: Payer: Self-pay

## 2018-07-23 NOTE — Telephone Encounter (Signed)
Pt seen on 07/14/2018; pt has been taking wellbutrin 37.5 mg daily in AM and pt is presently taking Effexor 37.5 mg bid. In the afternoon pt feels sluggish and very sleepy. Pt wants to know if it will take longer for pt to get accustomed to taking Effexor or should med be adjusted or changed. [facility]. Pt request cb.

## 2018-07-24 MED ORDER — VENLAFAXINE HCL 37.5 MG PO TABS: 75.0000 mg | ORAL_TABLET | Freq: Two times a day (BID) | ORAL | Status: DC

## 2018-07-24 NOTE — Telephone Encounter (Signed)
I would make one change at a time.  I would cut venlafaxine back to 37.5mg  at night for 4 days.  Don't take it in the AM.   Then stop venlafaxine after that.   Continue wellbutrin 300mg  a day for now.  Let me consider options to replace effexor in the meantime.   Please clarify an issue- ask patient if she has been on SSRI prev (paxil, prozac, lexapro, zoloft, etc).  Thanks.

## 2018-07-24 NOTE — Telephone Encounter (Signed)
Patient advised. Patient has never been on the other SSRI's mentioned.

## 2018-07-24 NOTE — Telephone Encounter (Signed)
Some of that fatigue may be related to taking less wellbutrin.  I would try to increase effexor to 75mg  in the AM, 37.5mg  in the PM for 4-5 days.   Then increase to 75mg  BID.  Would still continue wellbutrin 300mg  a day.  Update me as needed in the meantime and especially in about 10 days.  We can send in a new rx when her dose is stabilized.  Update me when refill needed, since she'll run out early.  Thanks.

## 2018-07-24 NOTE — Telephone Encounter (Signed)
The original message was taken incorrectly.  Patient says she is taking Wellbutrin 300 mg in the AM and Effexor 37.5 BID.  Patient states that she feels the Effexor is making her drowsy in the AM but the drowsiness wears off after a while but when she takes the Effexor in the PM, she has to go to bed a few hours after taking it.  Patient says she doesn't think she can increase to Effexor 75 mg BID.

## 2018-07-27 MED ORDER — ESCITALOPRAM OXALATE 5 MG PO TABS: 5.0000 mg | ORAL_TABLET | Freq: Every day | ORAL | 1 refills | Status: DC

## 2018-07-27 NOTE — Telephone Encounter (Signed)
When she is done with tapering effexor, then start lexapro 5mg  a day.  Continue wellbutrin 300mg  a day.  lexapro rx sent.  Thanks.

## 2018-07-27 NOTE — Telephone Encounter (Signed)
Patient advised.

## 2018-08-20 ENCOUNTER — Other Ambulatory Visit: Payer: Self-pay | Admitting: Family Medicine

## 2018-08-20 NOTE — Telephone Encounter (Signed)
Sent. Thanks.  Update me as needed, if continued need.

## 2018-08-20 NOTE — Telephone Encounter (Signed)
Last filled 07/14/2018 #30... Last OV 07/14/2018... please advise

## 2018-09-23 DIAGNOSIS — H16302 Unspecified interstitial keratitis, left eye: Secondary | ICD-10-CM | POA: Diagnosis not present

## 2018-10-06 ENCOUNTER — Other Ambulatory Visit: Payer: Self-pay | Admitting: Family Medicine

## 2018-10-06 NOTE — Telephone Encounter (Signed)
Electronic refill request. Tramadol Last office visit:   07/14/2018 Last Filled:    30 tablet 0 08/20/2018  Please advise.

## 2018-10-07 NOTE — Telephone Encounter (Signed)
Sent. Thanks.   

## 2018-10-29 ENCOUNTER — Other Ambulatory Visit: Payer: Self-pay | Admitting: Family Medicine

## 2018-11-04 ENCOUNTER — Telehealth: Payer: Self-pay

## 2018-11-04 NOTE — Telephone Encounter (Signed)
Pt left v/m that was stung by wasp on hand on 11/03/18. area around sting is more swollen and red and itching more today. The entire lt hand is swollen and red with red streak going up arm. I spoke with pt and there is warm to touch. When hangs lt hand down or tries to pick up something hand hurts. No difficulty breathing and no swelling in throat or mouth. Pt was lightheaded earlier. Pt will go to UC. FYI to Dr Damita Dunnings.

## 2018-11-04 NOTE — Telephone Encounter (Signed)
Noted.  Thanks.  Agree with urgent care.  Will await the follow-up notes.

## 2018-12-10 ENCOUNTER — Other Ambulatory Visit: Payer: Self-pay | Admitting: Family Medicine

## 2018-12-10 NOTE — Telephone Encounter (Signed)
Last filled on 10/07/2018 for #30 with 0 refill. LOV 07/14/2018. No future appointment

## 2018-12-11 NOTE — Telephone Encounter (Signed)
Sent. Thanks.   

## 2019-01-29 ENCOUNTER — Other Ambulatory Visit: Payer: Self-pay | Admitting: Family Medicine

## 2019-02-01 NOTE — Telephone Encounter (Signed)
Electronic refill request. Tramadol Last office visit:   07/14/2018 Last Filled:    30 tablet 0 12/11/2018  Please advise.

## 2019-02-02 NOTE — Telephone Encounter (Signed)
Sent. Thanks.   

## 2019-03-03 ENCOUNTER — Encounter: Payer: Self-pay | Admitting: Family Medicine

## 2019-03-03 ENCOUNTER — Ambulatory Visit: Payer: 59 | Admitting: Family Medicine

## 2019-03-03 ENCOUNTER — Other Ambulatory Visit: Payer: Self-pay

## 2019-03-03 ENCOUNTER — Ambulatory Visit (INDEPENDENT_AMBULATORY_CARE_PROVIDER_SITE_OTHER)
Admission: RE | Admit: 2019-03-03 | Discharge: 2019-03-03 | Disposition: A | Discharge status: Home / Self Care | Payer: 59 | Source: Ambulatory Visit | Attending: Family Medicine | Admitting: Family Medicine

## 2019-03-03 VITALS — BP 132/78 | HR 74 | Temp 98.7°F | Ht 66.0 in | Wt 182.5 lb

## 2019-03-03 DIAGNOSIS — M549 Dorsalgia, unspecified: Secondary | ICD-10-CM

## 2019-03-03 DIAGNOSIS — Z23 Encounter for immunization: Secondary | ICD-10-CM

## 2019-03-03 DIAGNOSIS — M533 Sacrococcygeal disorders, not elsewhere classified: Secondary | ICD-10-CM

## 2019-03-03 MED ORDER — CYCLOBENZAPRINE HCL 10 MG PO TABS: 10.0000 mg | ORAL_TABLET | Freq: Every evening | ORAL | 1 refills | Status: DC | PRN

## 2019-03-03 MED ORDER — HYDROCODONE-ACETAMINOPHEN 5-325 MG PO TABS: 1.0000 | ORAL_TABLET | Freq: Four times a day (QID) | ORAL | 0 refills | Status: DC | PRN

## 2019-03-03 MED ORDER — PREDNISONE 20 MG PO TABS: ORAL_TABLET | ORAL | 0 refills | Status: DC

## 2019-03-03 NOTE — Progress Notes (Signed)
Kathy Diem T. Marshon Bangs, MD Primary [facility] and [place] [facility] [place], [address] Phone: [phone]  FAX: [phone]  Kathy Avila - 58 y.o. female  MRN [id number]  Date of Birth: 07-08-1960  Visit Date: 03/03/2019  PCP: Tonia Ghent, MD  Referred by: Tonia Ghent, MD  Chief Complaint  Patient presents with  . Fall    [place] out of chair last Friday  . Back Pain   Subjective:   Kathy Avila is a 58 y.o. very pleasant female patient with Body mass index is 29.46 kg/m. who presents wThen got up her back and it really hurt and it was going into her boss.  Was hurting all weekend.  Hasa beeen on a heating pad.  Went to work yesterday.  From one side to  R> L  Doi: 02/26/2019  At this point she is not followed this case is a workers comp case.  This did happen in her workplace after hours.  She is still having significant pain in the lowest part of her lumbar spine as well as in the sacrum.  She rates her pain as 9 out of 10 at its worst.  Bothers her with sitting down significantly as well as with ambulation of her minor movements.  She is not having any radicular pain, numbness, tingling in her lower extremities.  She is not having any bowel or bladder incontinence.  No saddle anesthesia.  Lowest part of her back.  R side.   Patient presents with back pain after a fall.  History is significant for a prior cervical fusion at 4 levels done by Dr. Ronnald Ramp. Working to help decorate her chair at work.  Then she lost her balance.    She has already tried Tramadol 100 mg at a time with limited help. She rates her pain 9/10 today.  Past Medical History, Surgical History, Social History, Family History, Problem List, Medications, and Allergies have been reviewed and updated if relevant.  Patient Active Problem List   Diagnosis Date Noted  . Cervical vertebral fusion 01/09/2018  . History of MMR  vaccination 01/01/2018  . Paresthesia 10/19/2017  . Right lower quadrant abdominal pain 12/15/2016  . Sacro-iliac pain 10/15/2016  . Osteopenia 10/15/2016  . Tachycardia 08/07/2016  . Advance care planning 08/07/2016  . Menopausal symptoms   . GERD (gastroesophageal reflux disease)   . B12 deficiency   . Anxiety   . Depression, recurrent ([place]) 07/03/2007    Past Medical History:  Diagnosis Date  . Anxiety   . B12 deficiency   . Depression   . GERD (gastroesophageal reflux disease)   . Headache   . Menopausal symptoms   . PONV (postoperative nausea and vomiting)   . Wears contact lenses     Past Surgical History:  Procedure Laterality Date  . ANKLE RECONSTRUCTION Bilateral   . ANTERIOR CERVICAL DECOMPRESSION/DISCECTOMY FUSION 4 LEVELS N/A 01/09/2018   Procedure: CERVICAL THREE-FOUR, CERVICAL FOUR-FIVE, CERVICAL FIVE-SIX, CERVICAL SIX-SEVEN ANTERIOR CERVICAL DECOMPRESSION/DISCECTOMY FUSION;  Surgeon: Eustace Moore, MD;  Location: [facility];  Service: Neurosurgery;  Laterality: N/A;  . CARDIAC CATHETERIZATION N/A 09/06/2015   Procedure: Left Heart Cath and Coronary Angiography;  Surgeon: Corey Skains, MD;  Location: [facility];  Service: Cardiovascular;  Laterality: N/A;  . DILATION AND CURETTAGE OF UTERUS    . TONSILLECTOMY      Social History   Socioeconomic History  .  Marital status: Divorced    Spouse name: Not on file  . Number of children: Not on file  . Years of education: Not on file  . Highest education level: Not on file  Occupational History  . Not on file  Social Needs  . Financial resource strain: Not on file  . Food insecurity    Worry: Not on file    Inability: Not on file  . Transportation needs    Medical: Not on file    Non-medical: Not on file  Tobacco Use  . Smoking status: Never Smoker  . Smokeless tobacco: Never Used  Substance and Sexual Activity  . Alcohol use: Yes    Alcohol/week: 1.0 standard drinks    Types: 1 Glasses of  wine per week    Comment: occ/weekends  . Drug use: No  . Sexual activity: Not on file  Lifestyle  . Physical activity    Days per week: Not on file    Minutes per session: Not on file  . Stress: Not on file  Relationships  . Social Herbalist on phone: Not on file    Gets together: Not on file    Attends religious service: Not on file    Active member of club or organization: Not on file    Attends meetings of clubs or organizations: Not on file    Relationship status: Not on file  . Intimate partner violence    Fear of current or ex partner: Not on file    Emotionally abused: Not on file    Physically abused: Not on file    Forced sexual activity: Not on file  Other Topics Concern  . Not on file  Social History Narrative   Divorced.   2 grown daughters. Both out of town.   Lives with her boyfriend, Christophe Louis   Works at [employer], Aeronautical engineer.     Family History  Problem Relation Age of Onset  . Cirrhosis Mother        not from etoh per patient report  . Throat cancer Father   . Breast cancer Neg Hx   . Colon cancer Neg Hx     Allergies  Allergen Reactions  . Venlafaxine     fatigue    Medication list reviewed and updated in full in [facility].  GEN: No fevers, chills. Nontoxic. Primarily MSK c/o today. MSK: Detailed in the HPI GI: tolerating PO intake without difficulty Neuro: No numbness, parasthesias, or tingling associated. Otherwise the pertinent positives of the ROS are noted above.   Objective:   BP 132/78   Pulse 74   Temp 98.7 F (37.1 C) (Temporal)   Ht 5' 6"  (1.676 m)   Wt 182 lb 8 oz (82.8 kg)   SpO2 96%   BMI 29.46 kg/m    GEN: Well-developed,well-nourished,in no acute distress; alert,appropriate and cooperative throughout examination HEENT: Normocephalic and atraumatic without obvious abnormalities. Ears, externally no deformities PULM: Breathing comfortably in no respiratory distress EXT: No clubbing,  cyanosis, or edema PSYCH: Normally interactive. Cooperative during the interview. Pleasant. Friendly and conversant. Not anxious or depressed appearing. Normal, full affect.  Range of motion at  the waist: Flexion: normal Extension: normal Lateral bending: normal Rotation: all normal  No echymosis or edema Rises to examination table with no difficulty Gait: mildly antalgic  Inspection/Deformity: N Paraspinus Tenderness: TTP L3-s1 b The sacrum, in the upper portion is dramatically tender to palpation.  This is the  area of maximal pain.  B Ankle Dorsiflexion (L5,4): 5/5 B Great Toe Dorsiflexion (L5,4): 5/5 Heel Walk (L5): WNL Toe Walk (S1): WNL Rise/Squat (L4): WNL  SENSORY B Medial Foot (L4): WNL B Dorsum (L5): WNL B Lateral (S1): WNL Light Touch: WNL Pinprick: WNL  REFLEXES Knee (L4): 2+ Ankle (S1): 2+  B SLR, seated: neg B SLR, supine: back pain  Other special testing deferred due to back pain,, acute  Radiology: Dg Lumbar Spine Complete  Result Date: 03/04/2019 CLINICAL DATA:  58 year old female with recent fall. Low back pain. EXAM: LUMBAR SPINE - COMPLETE 4+ VIEW COMPARISON:  CT Abdomen and Pelvis 12/13/2016. FINDINGS: Normal lumbar segmentation. Chronic grade 1 anterolisthesis of L4 on L5 up to 9 millimeters appears increased since 2018. Chronic retrolisthesis at L5-S1 may also be mildly increased. Lesser retrolisthesis at L2-L3. No pars fracture. Stable vertebral body height. Visible lower thoracic levels and sacrum appear grossly intact. SI joints are within normal limits. Severe disc space loss at L5-S1 with chronic vacuum disc. Mild to moderate lumbar disc space loss elsewhere. Moderate to severe chronic facet degeneration at L4-L5 and L5-S1. Vacuum facet in 2018. Negative abdominal visceral contours. IMPRESSION: 1. No acute osseous abnormality identified in the lumbar spine. 2. Multilevel chronic lumbar spondylolisthesis, appears increased at L4-L5 since 2018. 3.  Advanced chronic lower lumbar disc and facet degeneration. Electronically Signed   By: Genevie Ann M.D.   On: 03/04/2019 08:10   Dg Sacrum/coccyx  Result Date: 03/04/2019 CLINICAL DATA:  58 year old female status post recent fall. EXAM: SACRUM AND COCCYX - 2+ VIEW COMPARISON:  Lumbar radiographs today reported separately. CT Abdomen and Pelvis 12/13/2016. FINDINGS: Bone mineralization is within normal limits. On the lateral view the sacrum and coccygeal segments appear stable since 2018. Sacral ala and SI joints appear intact. Visible pelvis intact. Negative pelvic visceral contours. IMPRESSION: No acute fracture or dislocation identified about the sacrum or coccyx. Electronically Signed   By: Genevie Ann M.D.   On: 03/04/2019 08:12     Assessment and Plan:     ICD-10-CM   1. Acute back pain, unspecified back location, unspecified back pain laterality  M54.9 DG Lumbar Spine Complete  2. Need for influenza vaccination  Z23 Flu Vaccine QUAD 6+ mos PF IM (Fluarix Quad PF)  3. Sacral back pain  M53.3 DG Sacrum/Coccyx   The patient has notable, quite significant pain in her sacrum.  She also has some low back pain that is lesser compared to the sacrum itself.  With a direct trauma to this area, at the very least there is significant bone bruise.  Given this traumatically injured low back and sacrum that occurred in the workplace, I did recommend that she discuss this with her job's HR.  I explained that typically an employer will have certain physicans to evaluate patients.  I am happy to care for her, however, I am unsure how this would relate given it was at work, but after hours.  Follow-up: No follow-ups on file.  Meds ordered this encounter  Medications  . predniSONE (DELTASONE) 20 MG tablet    Sig: 2 tabs po daily for 5 days, then 1 tab po daily for 5 days    Dispense:  15 tablet    Refill:  0  . cyclobenzaprine (FLEXERIL) 10 MG tablet    Sig: Take 1 tablet (10 mg total) by mouth at bedtime as  needed for muscle spasms.    Dispense:  30 tablet    Refill:  1  . HYDROcodone-acetaminophen (NORCO/VICODIN) 5-325 MG tablet    Sig: Take 1 tablet by mouth every 6 (six) hours as needed for up to 5 days for moderate pain or severe pain.    Dispense:  20 tablet    Refill:  0   Orders Placed This Encounter  Procedures  . DG Lumbar Spine Complete  . DG Sacrum/Coccyx  . Flu Vaccine QUAD 6+ mos PF IM (Fluarix Quad PF)    Signed,  Branko Steeves T. Lenoria Narine, MD   Outpatient Encounter Medications as of 03/03/2019  Medication Sig  . buPROPion (WELLBUTRIN XL) 300 MG 24 hr tablet TAKE 1 TABLET BY MOUTH  DAILY  . cholecalciferol (VITAMIN D) 1000 units tablet Take 1,000 Units by mouth daily.  Marland Kitchen escitalopram (LEXAPRO) 5 MG tablet Take 1 tablet (5 mg total) by mouth daily.  Marland Kitchen estradiol (VIVELLE-DOT) 0.1 MG/24HR patch Place 1 patch onto the skin every 3 (three) days.   . famotidine (PEPCID) 20 MG tablet Take 20 mg by mouth 2 (two) times daily.  Marland Kitchen MAGNESIUM CITRATE PO Take 1 tablet by mouth daily.  . methocarbamol (ROBAXIN) 500 MG tablet Take 1 tablet (500 mg total) by mouth every 6 (six) hours as needed for muscle spasms.  . Multiple Vitamin (MULTIVITAMIN WITH MINERALS) TABS tablet Take 1 tablet by mouth daily.  . progesterone (PROMETRIUM) 200 MG capsule Take 200 mg by mouth at bedtime.  . propranolol (INDERAL) 10 MG tablet Take 10 mg by mouth 2 (two) times daily.  . traMADol (ULTRAM) 50 MG tablet TAKE 1 TABLET BY MOUTH EVERY 12 HOURS ASNEEDED FOR MODERATE PAIN  . vitamin E 400 UNIT capsule Take 400 Units by mouth daily.  . cyclobenzaprine (FLEXERIL) 10 MG tablet Take 1 tablet (10 mg total) by mouth at bedtime as needed for muscle spasms.  Marland Kitchen HYDROcodone-acetaminophen (NORCO/VICODIN) 5-325 MG tablet Take 1 tablet by mouth every 6 (six) hours as needed for up to 5 days for moderate pain or severe pain.  . predniSONE (DELTASONE) 20 MG tablet 2 tabs po daily for 5 days, then 1 tab po daily for 5 days  .  [DISCONTINUED] benzonatate (TESSALON) 200 MG capsule Take 1 capsule (200 mg total) by mouth 3 (three) times daily as needed. Do not bite pill  . [DISCONTINUED] guaiFENesin-codeine (ROBITUSSIN AC) 100-10 MG/5ML syrup Take 5 mLs by mouth 3 (three) times daily as needed for cough. Caution of sedation, do not take if working or driving   No facility-administered encounter medications on file as of 03/03/2019.

## 2019-03-05 ENCOUNTER — Encounter: Payer: Self-pay | Admitting: Family Medicine

## 2019-03-19 ENCOUNTER — Other Ambulatory Visit: Payer: Self-pay | Admitting: *Deleted

## 2019-03-19 NOTE — Telephone Encounter (Signed)
Faxed refill request. Hydrocodone-APAP 5-325 mg Last office visit:   03/03/2019 Acute [facility] Last Filled:   #20 on 03/03/2019 Please advise.

## 2019-03-21 NOTE — Telephone Encounter (Signed)
Please get update from patient.  Did she request this or was it an automatic refill?  Is she seeing anybody else about it as a Nurse, adult. issue?  Please let me know.  Thanks.

## 2019-03-22 MED ORDER — HYDROCODONE-ACETAMINOPHEN 5-325 MG PO TABS: 1.0000 | ORAL_TABLET | Freq: Four times a day (QID) | ORAL | 0 refills | Status: AC | PRN

## 2019-03-22 NOTE — Telephone Encounter (Signed)
If she is not going to be following up with her worker's comp provider, I want her to follow-up in 2 weeks with me

## 2019-03-22 NOTE — Telephone Encounter (Signed)
I'll ask for Dr. Lurline Del input.  Thanks.

## 2019-03-22 NOTE — Telephone Encounter (Signed)
Left message for Kathy Avila to return my call.

## 2019-03-22 NOTE — Telephone Encounter (Signed)
No, she is not seeing anyone about a Worker's Comp Issue.  Patient says her back is still hurting and Dr. Lorelei Pont prescribed the Hydrocodone and she needs a refill.  Dr. Lorelei Pont is here today, does this need to be routed to him?

## 2019-03-23 NOTE — Telephone Encounter (Signed)
Left message for Keslyn to call the office and schedule a 2 week follow up with Dr. Lorelei Pont if she is not seeing any worker's comp providers.

## 2019-03-24 NOTE — Telephone Encounter (Signed)
I have left messages x 2 and patient has not called to schedule follow up appointment.  FYI to Dr. Lorelei Pont.

## 2019-04-13 ENCOUNTER — Other Ambulatory Visit: Payer: Self-pay | Admitting: Family Medicine

## 2019-06-28 IMAGING — CR DG CHEST 2V
2 series · 2 of 2 positions shown · non-contrast
Comparison: Chest radiograph September 05, 2015 and chest CT September 06, 2015

CLINICAL DATA: Preoperative evaluation for cervical fusion

EXAM:
CHEST - 2 VIEW

[w chest pa]
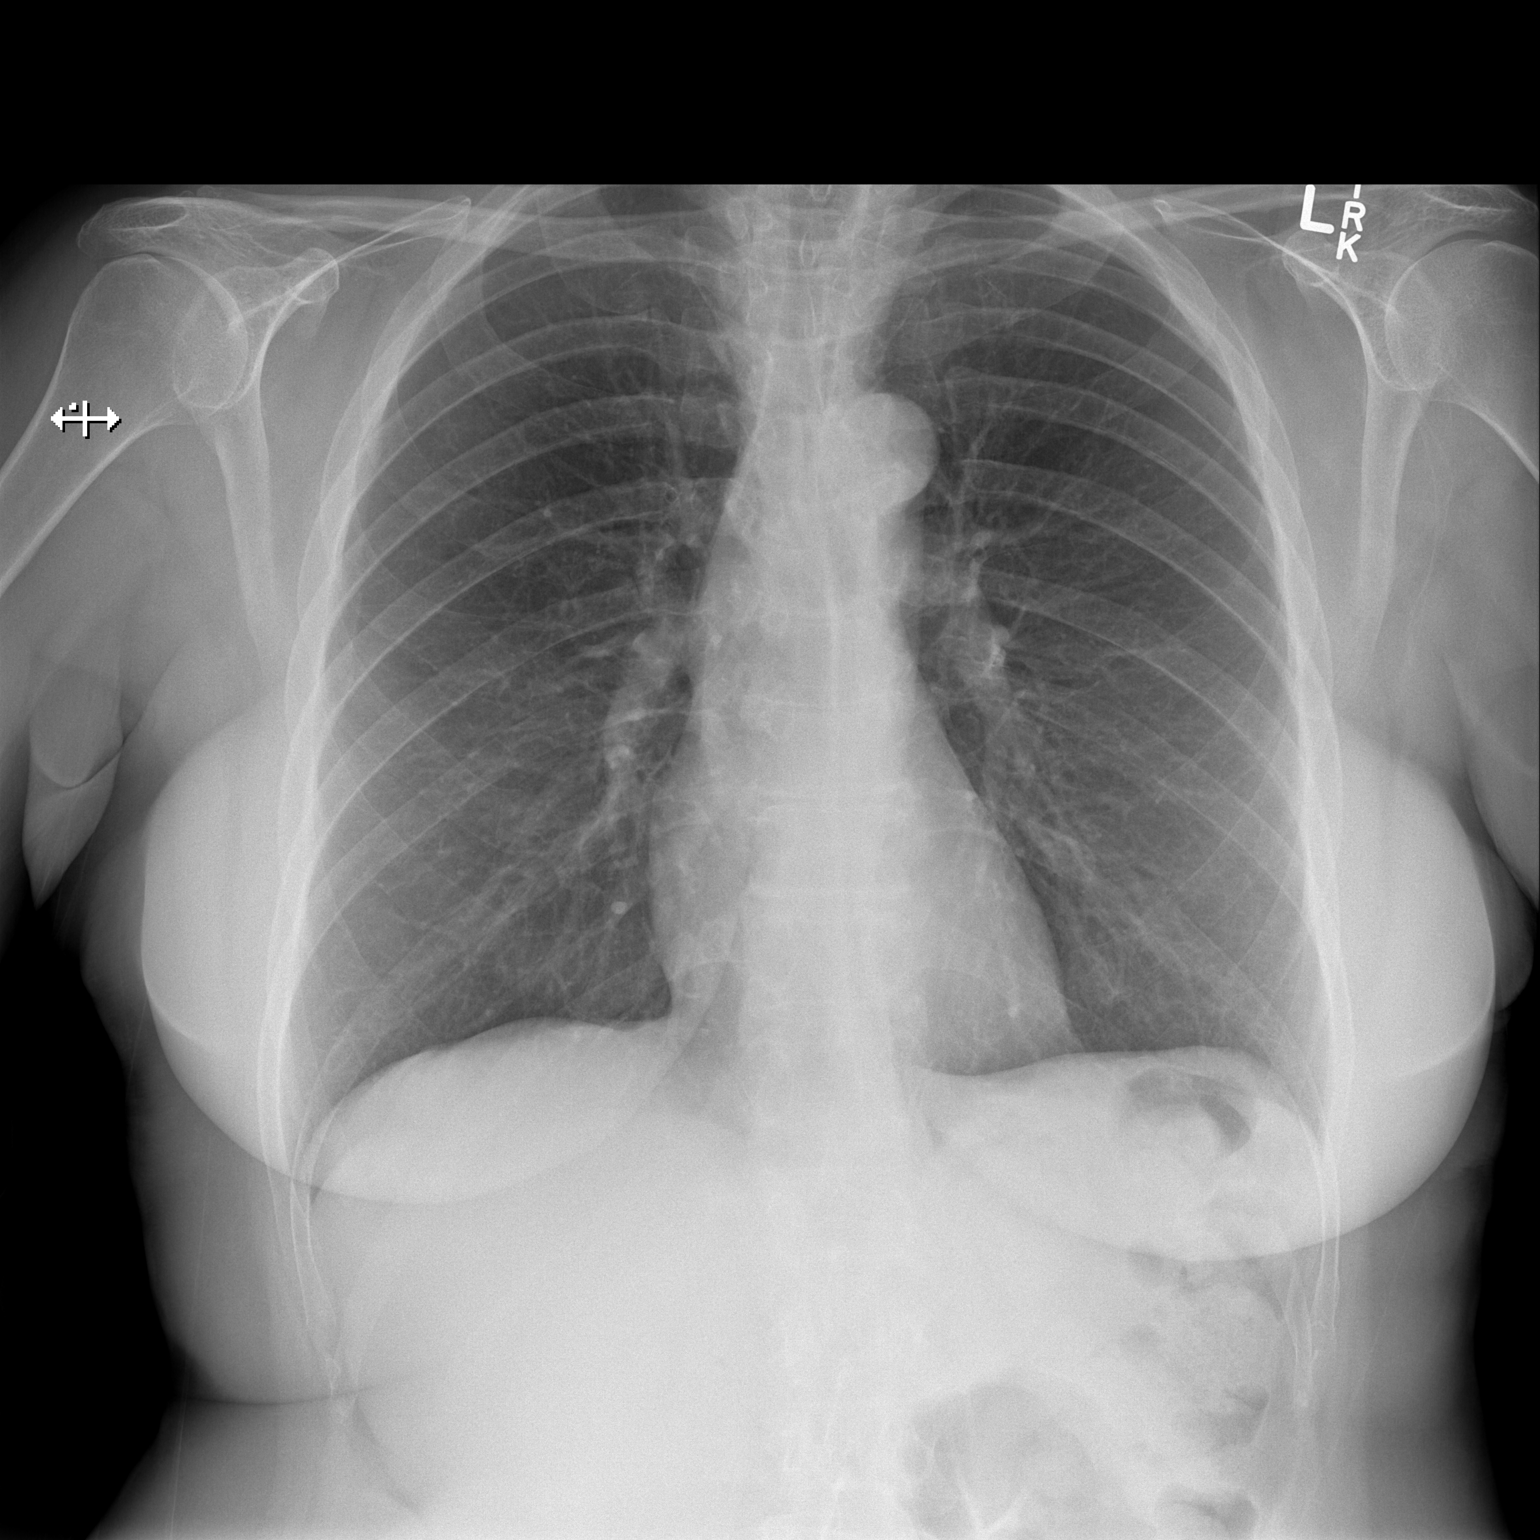

[w chest lat]
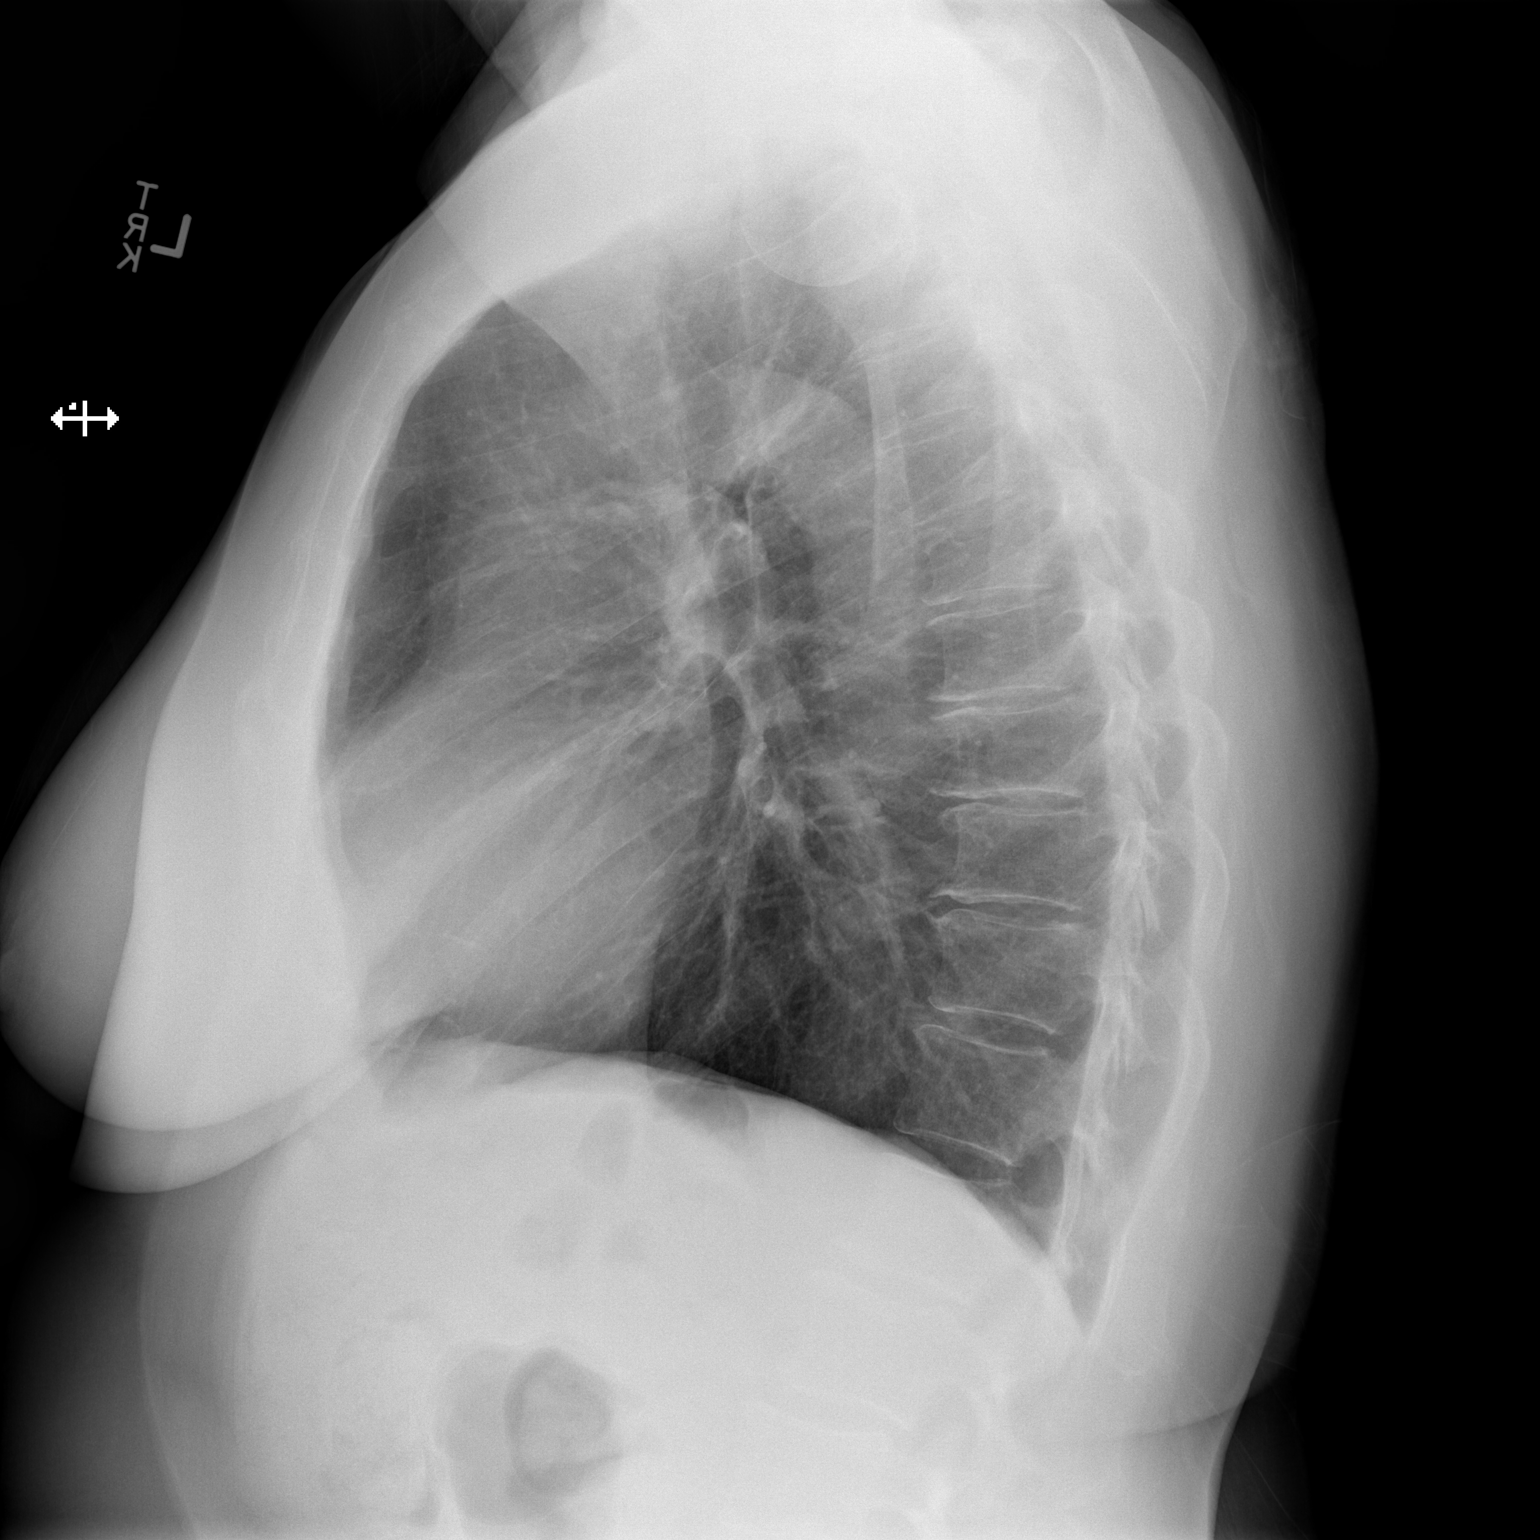

[2 of 2 positions shown; findings below may reference images not displayed]

FINDINGS: There is no evident edema or consolidation. The heart size and
pulmonary vascularity are normal. No adenopathy. There is
degenerative change in the thoracic spine.
IMPRESSION: No edema or consolidation.

## 2019-07-27 DIAGNOSIS — S32009K Unspecified fracture of unspecified lumbar vertebra, subsequent encounter for fracture with nonunion: Secondary | ICD-10-CM | POA: Insufficient documentation

## 2019-08-08 ENCOUNTER — Ambulatory Visit: Payer: 59 | Attending: Internal Medicine

## 2019-08-08 DIAGNOSIS — Z23 Encounter for immunization: Secondary | ICD-10-CM

## 2019-08-08 NOTE — Progress Notes (Signed)
   Covid-19 Vaccination Clinic  Name:  Kirstie Larsen    MRN: [id number] DOB: 15-Oct-1960  08/08/2019  Ms. Lengacher was observed post Covid-19 immunization for 15 minutes without incident. She was provided with Vaccine Information Sheet and instruction to access the V-Safe system.   Ms. Ahlgrim was instructed to call 911 with any severe reactions post vaccine: Marland Kitchen Difficulty breathing  . Swelling of face and throat  . A fast heartbeat  . A bad rash all over body  . Dizziness and weakness   Immunizations Administered    Name Date Dose VIS Date Route   Pfizer COVID-19 Vaccine 08/08/2019  9:42 AM 0.3 mL 04/23/2019 Intramuscular   Manufacturer: [employer], [employer]   Lot: [id number]   NDC: [address]-6

## 2019-08-31 ENCOUNTER — Ambulatory Visit: Payer: 59 | Attending: Internal Medicine

## 2019-08-31 DIAGNOSIS — Z23 Encounter for immunization: Secondary | ICD-10-CM

## 2019-08-31 NOTE — Progress Notes (Signed)
   Covid-19 Vaccination Clinic  Name:  Kathy Avila    MRN: [id number] DOB: [birth year]  08/31/2019  Kathy Avila was observed post Covid-19 immunization for 15 minutes without incident. She was provided with Vaccine Information Sheet and instruction to access the V-Safe system.   Kathy Avila was instructed to call 911 with any severe reactions post vaccine: Marland Kitchen Difficulty breathing  . Swelling of face and throat  . A fast heartbeat  . A bad rash all over body  . Dizziness and weakness   Immunizations Administered    Name Date Dose VIS Date Route   Pfizer COVID-19 Vaccine 08/31/2019  3:44 PM 0.3 mL 07/07/2018 Intramuscular   Manufacturer: [employer], [employer]   Lot: [id number]   NDC: [address]-9

## 2019-09-09 ENCOUNTER — Telehealth: Payer: Self-pay

## 2019-09-09 NOTE — Telephone Encounter (Signed)
Pt called to let us know if we receive and short term disability paperwork for her, we do not need to fill that out. It goes to her other doctor that is taking care of the problem.

## 2019-09-22 ENCOUNTER — Other Ambulatory Visit: Payer: Self-pay | Admitting: Family Medicine

## 2019-10-26 DIAGNOSIS — I10 Essential (primary) hypertension: Secondary | ICD-10-CM | POA: Insufficient documentation

## 2019-12-16 ENCOUNTER — Other Ambulatory Visit: Payer: Self-pay | Admitting: Family Medicine

## 2019-12-17 NOTE — Telephone Encounter (Signed)
Electronic refill request. Wellbutrin Last office visit:   07/14/2018  With an acute with [facility] 02/2019 Last Filled:   90 tablet 0 09/23/2019   Patient advised at last refill to schedule appt, none scheduled.

## 2019-12-19 NOTE — Telephone Encounter (Signed)
Sent. Thanks.  Note put on refill to have her contact the clinic for a physical.

## 2019-12-20 ENCOUNTER — Other Ambulatory Visit: Payer: Self-pay | Admitting: Neurological Surgery

## 2019-12-20 DIAGNOSIS — Z981 Arthrodesis status: Secondary | ICD-10-CM

## 2019-12-29 ENCOUNTER — Emergency Department
Admission: EM | Admit: 2019-12-29 | Discharge: 2019-12-29 | Disposition: A | Discharge status: Home / Self Care | Payer: 59 | Attending: Emergency Medicine | Admitting: Emergency Medicine

## 2019-12-29 ENCOUNTER — Other Ambulatory Visit: Payer: Self-pay

## 2019-12-29 ENCOUNTER — Encounter: Payer: Self-pay | Admitting: Emergency Medicine

## 2019-12-29 ENCOUNTER — Emergency Department: Payer: 59

## 2019-12-29 ENCOUNTER — Other Ambulatory Visit: Payer: 59

## 2019-12-29 DIAGNOSIS — R42 Dizziness and giddiness: Secondary | ICD-10-CM | POA: Insufficient documentation

## 2019-12-29 DIAGNOSIS — R202 Paresthesia of skin: Secondary | ICD-10-CM | POA: Diagnosis not present

## 2019-12-29 DIAGNOSIS — R110 Nausea: Secondary | ICD-10-CM | POA: Insufficient documentation

## 2019-12-29 DIAGNOSIS — Z5321 Procedure and treatment not carried out due to patient leaving prior to being seen by health care provider: Secondary | ICD-10-CM | POA: Diagnosis not present

## 2019-12-29 DIAGNOSIS — H938X9 Other specified disorders of ear, unspecified ear: Secondary | ICD-10-CM | POA: Diagnosis not present

## 2019-12-29 LAB — CBC
HCT: 42 % (ref 36.0–46.0)
Hemoglobin: 13.4 g/dL (ref 12.0–15.0)
MCH: 24.9 pg — ABNORMAL LOW (ref 26.0–34.0)
MCHC: 31.9 g/dL (ref 30.0–36.0)
MCV: 77.9 fL — ABNORMAL LOW (ref 80.0–100.0)
Platelets: 290 10*3/uL (ref 150–400)
RBC: 5.39 MIL/uL — ABNORMAL HIGH (ref 3.87–5.11)
RDW: 14.8 % (ref 11.5–15.5)
WBC: 9.8 10*3/uL (ref 4.0–10.5)
nRBC: 0 % (ref 0.0–0.2)

## 2019-12-29 LAB — BASIC METABOLIC PANEL
Anion gap: 11 (ref 5–15)
BUN: <5 mg/dL — ABNORMAL LOW (ref 8–23)
CO2: 24 mmol/L (ref 22–32)
Calcium: 9.3 mg/dL (ref 8.9–10.3)
Chloride: 102 mmol/L (ref 98–111)
Creatinine, Ser: 0.81 mg/dL (ref 0.44–1.00)
GFR calc Af Amer: >60 mL/min (ref >60)
GFR calc non Af Amer: >60 mL/min (ref >60)
Glucose, Bld: 115 mg/dL — ABNORMAL HIGH (ref 70–99)
Potassium: 3.1 mmol/L — ABNORMAL LOW (ref 3.5–5.1)
Sodium: 137 mmol/L (ref 135–145)

## 2019-12-29 LAB — URINALYSIS, COMPLETE (UACMP) WITH MICROSCOPIC
Bacteria, UA: NONE SEEN
Bilirubin Urine: NEGATIVE
Glucose, UA: NEGATIVE mg/dL
Hgb urine dipstick: NEGATIVE
Ketones, ur: 20 mg/dL — AB
Leukocytes,Ua: NEGATIVE
Nitrite: NEGATIVE
Protein, ur: NEGATIVE mg/dL
Specific Gravity, Urine: 1.023 (ref 1.005–1.030)
pH: 5 (ref 5.0–8.0)

## 2019-12-29 NOTE — ED Triage Notes (Addendum)
Patient states her ears felt like they were "stopped up" yesterday but today patient reports feeling very dizzy.  Patient states she drove a little while but had to pull into a parking lot because she felt very nausea, dizzy and light headed.  Patient reports numbness to her left arm.  Patient reports "heaviness" to her right leg previously but states it has resolved now.  Patient states she feels less, "spinning" but feels light headed.

## 2019-12-30 ENCOUNTER — Telehealth: Payer: Self-pay

## 2019-12-30 ENCOUNTER — Encounter: Payer: Self-pay | Admitting: Family Medicine

## 2019-12-30 ENCOUNTER — Ambulatory Visit: Payer: 59 | Admitting: Family Medicine

## 2019-12-30 VITALS — BP 142/90 | HR 107 | Temp 98.4°F | Wt 171.5 lb

## 2019-12-30 DIAGNOSIS — I679 Cerebrovascular disease, unspecified: Secondary | ICD-10-CM | POA: Diagnosis not present

## 2019-12-30 DIAGNOSIS — M545 Low back pain, unspecified: Secondary | ICD-10-CM

## 2019-12-30 DIAGNOSIS — G8929 Other chronic pain: Secondary | ICD-10-CM | POA: Diagnosis not present

## 2019-12-30 DIAGNOSIS — I951 Orthostatic hypotension: Secondary | ICD-10-CM

## 2019-12-30 NOTE — Telephone Encounter (Signed)
See note from visit

## 2019-12-30 NOTE — Assessment & Plan Note (Signed)
Hx of symptoms on standing/lightheadedness and VS consistent with orthostatic. Advised increased hydration and limit caffeine. HTN today - though suspect this may be pain +/- anxiety about symptoms. Return to see PCP in 2-4 weeks.

## 2019-12-30 NOTE — Assessment & Plan Note (Signed)
Currently getting treatment and new imaging this week. Endorses worsening pain and symptoms. Suspect weakness in LE is 2/2 to pain in back which may also be contributing to her concerns about balance and some dizziness 2/2 to pain. She will call back to update med list and continue care with pain/back specialist.

## 2019-12-30 NOTE — Telephone Encounter (Signed)
Agree.  I thank all involved.

## 2019-12-30 NOTE — Progress Notes (Signed)
Subjective:     Kathy Avila is a 59 y.o. female presenting for Fatigue (x 1 day ) and Ear Fullness (R x 2 days )     HPI  #Dizziness - 2 nights ago ear felt stopped up  - used ear drops and laid on heating pad - still felt full yesterday - was at work and suddenly felt like everything was in circles - felt lightheaded - symptoms started while sitting - was drinking soda, small snacks that day - endorses some lumbar back pain as well - was walking to one side - had to hold on to something to walk - had to leave work early due to symptoms - went into her car  - endorses associated symptoms of nausea and sour burps - drove from [place] to 60 in [place] and stopped driving - leg started having tingling and couldn't feel it (right leg) - went to the hospital - had the EKG and blood drawn   Prior to this endorses more stress at work and home - but otherwise feeling well    Endorses continued lightheadedness and difficulty walking straight  Symptoms worse when getting up to standing and improves with sitting down  Water: 2 bottles of water Caffeine: 1 cup coffee, 32 oz of Dr. Reino Kent   Review of Systems   Social History   Tobacco Use  Smoking Status Never Smoker  Smokeless Tobacco Never Used        Objective:    BP Readings from Last 3 Encounters:  12/30/19 (!) 142/90  12/29/19 (!) 143/88  03/03/19 132/78   Wt Readings from Last 3 Encounters:  12/30/19 171 lb 8 oz (77.8 kg)  12/29/19 180 lb (81.6 kg)  03/03/19 182 lb 8 oz (82.8 kg)    BP (!) 142/90   Pulse (!) 107   Temp 98.4 F (36.9 C) (Temporal)   Wt 171 lb 8 oz (77.8 kg)   SpO2 97%   BMI 26.08 kg/m   Orthostatic VS Lying 149/92 Sitting 144/81 Standing 138/87   Physical Exam Constitutional:      General: She is not in acute distress.    Appearance: She is well-developed. She is not diaphoretic.  HENT:     Head: Normocephalic and atraumatic.     Right Ear: Hearing and  external ear normal. There is impacted cerumen.     Left Ear: Hearing, tympanic membrane and external ear normal.     Nose: Nose normal.     Mouth/Throat:     Mouth: Mucous membranes are moist.  Eyes:     Extraocular Movements: Extraocular movements intact.     Conjunctiva/sclera: Conjunctivae normal.     Pupils: Pupils are equal, round, and reactive to light.     Comments: Slight nystagmus to the right with far leftward gaze  Cardiovascular:     Rate and Rhythm: Normal rate and regular rhythm.     Heart sounds: No murmur heard.   Pulmonary:     Effort: Pulmonary effort is normal. No respiratory distress.     Breath sounds: Normal breath sounds. No wheezing.  Musculoskeletal:     Cervical back: Neck supple.  Skin:    General: Skin is warm and dry.     Capillary Refill: Capillary refill takes less than 2 seconds.  Neurological:     General: No focal deficit present.     Mental Status: She is alert. Mental status is at baseline.     Cranial Nerves: No  cranial nerve deficit.     Motor: Weakness (bilateral LE weakness) present.     Gait: Gait normal.     Deep Tendon Reflexes: Reflexes normal.  Psychiatric:        Mood and Affect: Mood normal.        Behavior: Behavior normal.      CT scan: no acute finding, mild chronic ischemic changes MR Brain: chronic small vessel disease vs  Possible demyelinating      Assessment & Plan:   Problem List Items Addressed This Visit      Cardiovascular and Mediastinum   Orthostatic hypotension - Primary    Hx of symptoms on standing/lightheadedness and VS consistent with orthostatic. Advised increased hydration and limit caffeine. HTN today - though suspect this may be pain +/- anxiety about symptoms. Return to see PCP in 2-4 weeks.       Cerebral vascular disease    Briefly discussed ischemic findings on disease. Reassured pt that I do not think she experienced a TIA but recommended further discussion with PCP about treatemnt. Discussed  that she may benefit from statin therapy but to discuss with PCP. She is getting annual labs with work next week and schedule appt with pcp to review after the results are back.         Other   Chronic low back pain    Currently getting treatment and new imaging this week. Endorses worsening pain and symptoms. Suspect weakness in LE is 2/2 to pain in back which may also be contributing to her concerns about balance and some dizziness 2/2 to pain. She will call back to update med list and continue care with pain/back specialist.       Relevant Medications   HYDROcodone-acetaminophen (NORCO/VICODIN) 5-325 MG tablet   meloxicam (MOBIC) 7.5 MG tablet   tiZANidine (ZANAFLEX) 2 MG tablet       Return in about 4 weeks (around 01/27/2020) for discuss imaging with Dr. Para March.  Lynnda Child, MD  This visit occurred during the SARS-CoV-2 public health emergency.  Safety protocols were in place, including screening questions prior to the visit, additional usage of staff PPE, and extensive cleaning of exam room while observing appropriate contact time as indicated for disinfecting solutions.

## 2019-12-30 NOTE — Telephone Encounter (Signed)
Pt said was at work on 12/29/19 with sudden onset of dizziness, lightheadedness, could not walk straight,felt like could pass out but pt did not pass out; pt could not see or hear well.lt arm tingling. Pt was going to drive home and rt leg begin to feel numb and pt could not feel foot pedal and pt pulled off road and fiancee picked pt up and took pt to [facility] ED. Pt waited for several hours after had CT and MRI of head and labs and pt spoke with nurse who advised pt if she was going to leave to ck with PCP. Today pt does not have above symptoms; pt feels weak and ears feel stopped up but pt can hear. Pt said vision is blurred but she knows that is due to contacts. Pt has no covid symptoms, no travel and no known exposure to + covid. Pt had pfizer covid vaccines on 08/08/19 and 08/31/19. BP 143/88 P 78 when at ED on 12/29/19. Pt does not have way to ck vitals now. I spoke with DR Para March and he spoke with Dr Selena Batten who will see pt this afternoon at 2:40 for 40' appt. ED precautions given and pt voiced understanding.

## 2019-12-30 NOTE — Assessment & Plan Note (Signed)
Briefly discussed ischemic findings on disease. Reassured pt that I do not think she experienced a TIA but recommended further discussion with PCP about treatemnt. Discussed that she may benefit from statin therapy but to discuss with PCP. She is getting annual labs with work next week and schedule appt with pcp to review after the results are back.

## 2019-12-30 NOTE — Patient Instructions (Signed)
#   Dizziness - Orthostatic Hypotension - Drink at least 4 bottles of water per day - Decrease caffeine by 20 ounces    # findings on imaging - you will likely benefit from a cholesterol medication - return to discuss with Dr. Para March  #Ear fullness - use over the counter ear wax softening drops - do not use q-tips

## 2019-12-31 ENCOUNTER — Ambulatory Visit
Admit: 2019-12-31 | Discharge: 2019-12-31 | Disposition: A | Discharge status: Home / Self Care | Payer: 59 | Attending: Neurological Surgery | Admitting: Neurological Surgery

## 2019-12-31 ENCOUNTER — Other Ambulatory Visit: Payer: Self-pay

## 2019-12-31 DIAGNOSIS — Z981 Arthrodesis status: Secondary | ICD-10-CM

## 2020-01-06 LAB — HEMOGLOBIN A1C: Hemoglobin A1C: 5.4

## 2020-01-06 LAB — LIPID PANEL
Cholesterol: 190 (ref 0–200)
HDL: 56 (ref 35–70)
LDL Cholesterol: 114
Triglycerides: 111 (ref 40–160)

## 2020-01-06 LAB — BASIC METABOLIC PANEL
Creatinine: 1.2 — AB (ref 0.5–1.1)
Glucose: 91

## 2020-01-27 ENCOUNTER — Ambulatory Visit: Payer: 59 | Admitting: Family Medicine

## 2020-01-27 ENCOUNTER — Other Ambulatory Visit: Payer: Self-pay

## 2020-01-27 ENCOUNTER — Encounter: Payer: Self-pay | Admitting: Family Medicine

## 2020-01-27 VITALS — BP 142/84 | HR 97 | Temp 97.6°F | Ht 68.0 in | Wt 186.5 lb

## 2020-01-27 DIAGNOSIS — Z23 Encounter for immunization: Secondary | ICD-10-CM

## 2020-01-27 DIAGNOSIS — I679 Cerebrovascular disease, unspecified: Secondary | ICD-10-CM

## 2020-01-27 NOTE — Progress Notes (Signed)
This visit occurred during the SARS-CoV-2 public health emergency.  Safety protocols were in place, including screening questions prior to the visit, additional usage of staff PPE, and extensive cleaning of exam room while observing appropriate contact time as indicated for disinfecting solutions.  She is getting married in 6 weeks.  Congratulations given to patient.  She had episode of orthostasis/vertigo.  She had OV here 12/30/19.  No similar sx in the meantime.    MRI 12/29/19 discussed with patient. IMPRESSION: No evidence of acute intracranial abnormality, including acute infarction.  Moderate multifocal T2 hyperintense signal changes within the cerebral white matter which are advanced for age. Findings are nonspecific but most commonly seen on the basis of chronic small vessel ischemia. Potential alternative etiologies include a demyelinating process such as multiple sclerosis or sequelae of a prior infectious/inflammatory process, among others.  Mild ethmoid sinus mucosal thickening.  She had back surgery in the meantime.  She is seeing Dr. Murray Hodgkins.  She is still having some pain on the R side of lower back.  She had interval imaging.  I will defer to the back clinic.  She agrees.  Meds, vitals, and allergies reviewed.   ROS: Per HPI unless specifically indicated in ROS section   GEN: nad, alert and oriented HEENT: ncat NECK: supple w/o LA CV: rrr.  PULM: ctab, no inc wob ABD: soft, +bs EXT: no edema SKIN: no acute rash CN 2-12 wnl B, S/S wnl x4

## 2020-01-27 NOTE — Patient Instructions (Signed)
Keep drinking enough water to keep your urine clear.  Please get me a copy of your labs so I can see options with cholesterol medicine.  Update me as needed.  Take care.  Glad to see you.

## 2020-01-28 ENCOUNTER — Telehealth: Payer: Self-pay | Admitting: Family Medicine

## 2020-01-28 DIAGNOSIS — I679 Cerebrovascular disease, unspecified: Secondary | ICD-10-CM

## 2020-01-28 NOTE — Telephone Encounter (Signed)
Patient came into office and dropped off lab results from [facility] as requested by PCP. Placed on cart.

## 2020-01-29 ENCOUNTER — Other Ambulatory Visit: Payer: Self-pay | Admitting: Family Medicine

## 2020-01-30 NOTE — Telephone Encounter (Signed)
I'll check the hard copy. Thanks.  

## 2020-01-30 NOTE — Assessment & Plan Note (Addendum)
Discussed options.  Unremarkable neurologic exam at this point with no new symptoms in the meantime.  MRI previously done and discussed with patient.  Discussed options regarding lipid management going forward.  She had labs done at outside facility.  She will get me a hard copy of those, I will review those and we will be in touch.  We talked about potential statin start but I want to see her labs first.  She agrees with plan. At least 30 minutes were devoted to patient care in this encounter (this can potentially include time spent reviewing the patient's file/history, interviewing and examining the patient, counseling/reviewing plan with patient, ordering referrals, ordering tests, reviewing relevant laboratory or x-ray data, and documenting the encounter).

## 2020-03-15 ENCOUNTER — Encounter: Payer: Self-pay | Admitting: Family Medicine

## 2020-03-15 MED ORDER — ATORVASTATIN CALCIUM 10 MG PO TABS: 10.0000 mg | ORAL_TABLET | Freq: Every day | ORAL | 3 refills | Status: DC

## 2020-03-15 NOTE — Telephone Encounter (Signed)
Pt states that she is okay with starting Lipitor 10 mg a day, if that is what Dr. Para March thinks she should do.  Pt uses [facility].

## 2020-03-15 NOTE — Telephone Encounter (Signed)
I sent the prescription.  If she does not tolerate it due to muscle aches then stop the medication and let me know.  We need to recheck her fasting labs in about 2 months.  I put in the orders but if she wants to get the labs drawn out of clinic let me know and I will send her a letter.  Thanks.

## 2020-03-15 NOTE — Telephone Encounter (Signed)
Letter done.  Please make sure it printed.  Please send to patient after I sign it.  Thanks. 

## 2020-03-15 NOTE — Telephone Encounter (Signed)
Patient notified as instructed by telephone and verbalized understanding. Patient stated that she would rather have the lab work done at [facility]. Patient requested that a letter be mailed to her with the lab order on it.

## 2020-03-15 NOTE — Addendum Note (Signed)
Addended by: Joaquim Nam on: 03/15/2020 02:25 PM   Modules accepted: Orders

## 2020-03-15 NOTE — Telephone Encounter (Signed)
Placed on your desk for signature.

## 2020-04-24 DIAGNOSIS — Z79890 Hormone replacement therapy (postmenopausal): Secondary | ICD-10-CM | POA: Insufficient documentation

## 2020-04-24 DIAGNOSIS — F1120 Opioid dependence, uncomplicated: Secondary | ICD-10-CM | POA: Insufficient documentation

## 2020-04-24 DIAGNOSIS — Z79899 Other long term (current) drug therapy: Secondary | ICD-10-CM | POA: Insufficient documentation

## 2020-08-02 ENCOUNTER — Telehealth: Payer: Self-pay

## 2020-08-02 NOTE — Telephone Encounter (Signed)
Agree with the eval today.  Thanks.  Will await urgent care report.

## 2020-08-02 NOTE — Telephone Encounter (Signed)
Pt left v/m that since Jan 2022 when pt dx with + covid pt has had chest tightness on and off; pt still has dry deep cough since covid; pt was also advised by provider that dx covid that pt has a heart murmur and should FU with cardiologist; pt said she has been busy and this is first opportunity to call for appt. Pt said on 08/01/20 pt had about a 5 min episode of dull mid chest pain, chest tightness, lt arm felt tingly and numb and pt said heart was beating fast. After episode ended pt felt very tired and weak and pt rested after that; this morning pt still has tightness in chest but no CP. Pt does not have SOB. Pt said her husband will take her to an UC sometime today. Advised pt she should be checked out sooner than later and pt voiced understanding with UC & ED precautions. Sending note to Dr Para March.

## 2020-08-18 DIAGNOSIS — R011 Cardiac murmur, unspecified: Secondary | ICD-10-CM | POA: Insufficient documentation

## 2020-09-07 ENCOUNTER — Ambulatory Visit: Payer: 59 | Admitting: Family Medicine

## 2020-09-29 ENCOUNTER — Ambulatory Visit: Payer: 59 | Admitting: Cardiology

## 2020-10-05 ENCOUNTER — Telehealth: Payer: Self-pay | Admitting: *Deleted

## 2020-10-05 NOTE — Telephone Encounter (Signed)
Mrs. Kathy Avila left voicemail on triage.  She fell at work and now her back is sore.  She is concerned because she had back surgery in February on last year.  She is asking to be seen today for possible x-ray. She wasn't sure if she should see [place] or her back doctore who did her back surgery.   I spoke with Mrs. Kathy Avila and ask if this fall was reported to her employer due to this would possible be a workers comp case.  She states she did and filled out paperwork.  I offered her an appointment tomorrow but she wanted to be seen today.  There are no available appointments today here at [facility]. She will call her back doctor to see if they can get her in today.  The other option I recommended is a [facility] with x-ray capability.

## 2020-10-06 NOTE — Telephone Encounter (Signed)
Noted. Thanks. Agreed.  

## 2020-10-13 ENCOUNTER — Encounter: Payer: Self-pay | Admitting: Family Medicine

## 2020-10-13 ENCOUNTER — Ambulatory Visit: Payer: 59 | Admitting: Family Medicine

## 2020-10-13 ENCOUNTER — Other Ambulatory Visit: Payer: Self-pay

## 2020-10-13 VITALS — BP 124/78 | HR 134 | Temp 98.0°F | Ht 68.0 in | Wt 182.0 lb

## 2020-10-13 DIAGNOSIS — R0789 Other chest pain: Secondary | ICD-10-CM | POA: Diagnosis not present

## 2020-10-13 DIAGNOSIS — I498 Other specified cardiac arrhythmias: Secondary | ICD-10-CM

## 2020-10-13 MED ORDER — PROPRANOLOL HCL 10 MG PO TABS
10.0000 mg | ORAL_TABLET | Freq: Two times a day (BID) | ORAL | 1 refills | Status: DC | PRN
Start: 2020-10-13 — End: 2022-06-21

## 2020-10-13 NOTE — Progress Notes (Signed)
This visit occurred during the SARS-CoV-2 public health emergency.  Safety protocols were in place, including screening questions prior to the visit, additional usage of staff PPE, and extensive cleaning of exam room while observing appropriate contact time as indicated for disinfecting solutions.  She has been off propranolol.  She has had episodic chest discomfort for the last month or so, may or may not happen at rest, may or may not happen with exertion.  She has some burping, some relief with PPI vs H2 blocker.  She has noted occ heart rate elevation on home monitoring.    She had talked with outside clinic re: back pain.  Lower back is sore after prev fall with f/u pending with Dr. Murray Hodgkins.    Meds, vitals, and allergies reviewed.   ROS: Per HPI unless specifically indicated in ROS section   Recheck pulse 98 with bigeminy on exam.   GEN: nad, alert and oriented HEENT: nct NECK: supple w/o LA CV: Sounds to be in regular bigeminy.  Not tachycardic.  Not bradycardic. PULM: ctab, no inc wob ABD: soft, +bs EXT: no edema SKIN: no acute rash  EKG discussed with patient at office visit.

## 2020-10-13 NOTE — Patient Instructions (Signed)
Go to the lab on the way out.   If you have mychart we'll likely use that to update you.    We'll call about seeing cardiology.  If you notice your heart racing, then use the propranolol.  Rest in the meantime.  Limit exertion.   Take care.  Glad to see you.

## 2020-10-14 LAB — COMPREHENSIVE METABOLIC PANEL
ALT: 13 IU/L (ref 0–32)
AST: 15 IU/L (ref 0–40)
Albumin/Globulin Ratio: 1.8 (ref 1.2–2.2)
Albumin: 4.4 g/dL (ref 3.8–4.8)
Alkaline Phosphatase: 100 IU/L (ref 44–121)
BUN/Creatinine Ratio: 14 (ref 12–28)
BUN: 13 mg/dL (ref 8–27)
Bilirubin Total: 0.2 mg/dL (ref 0.0–1.2)
CO2: 24 mmol/L (ref 20–29)
Calcium: 9.6 mg/dL (ref 8.7–10.3)
Chloride: 103 mmol/L (ref 96–106)
Creatinine, Ser: 0.96 mg/dL (ref 0.57–1.00)
Globulin, Total: 2.5 g/dL (ref 1.5–4.5)
Glucose: 94 mg/dL (ref 65–99)
Potassium: 4.3 mmol/L (ref 3.5–5.2)
Sodium: 139 mmol/L (ref 134–144)
Total Protein: 6.9 g/dL (ref 6.0–8.5)
eGFR: 67 mL/min/{1.73_m2} (ref >59)

## 2020-10-14 LAB — CBC WITH DIFFERENTIAL/PLATELET
Basophils Absolute: 0.1 10*3/uL (ref 0.0–0.2)
Basos: 1 %
EOS (ABSOLUTE): 0.2 10*3/uL (ref 0.0–0.4)
Eos: 2 %
Hematocrit: 45.5 % (ref 34.0–46.6)
Hemoglobin: 14.5 g/dL (ref 11.1–15.9)
Immature Grans (Abs): 0 10*3/uL (ref 0.0–0.1)
Immature Granulocytes: 0 %
Lymphocytes Absolute: 3.1 10*3/uL (ref 0.7–3.1)
Lymphs: 33 %
MCH: 25 pg — ABNORMAL LOW (ref 26.6–33.0)
MCHC: 31.9 g/dL (ref 31.5–35.7)
MCV: 79 fL (ref 79–97)
Monocytes Absolute: 0.7 10*3/uL (ref 0.1–0.9)
Monocytes: 7 %
Neutrophils Absolute: 5.6 10*3/uL (ref 1.4–7.0)
Neutrophils: 57 %
Platelets: 268 10*3/uL (ref 150–450)
RBC: 5.79 x10E6/uL — ABNORMAL HIGH (ref 3.77–5.28)
RDW: 14.2 % (ref 11.7–15.4)
WBC: 9.6 10*3/uL (ref 3.4–10.8)

## 2020-10-14 LAB — TSH: TSH: 2.05 u[IU]/mL (ref 0.450–4.500)

## 2020-10-15 DIAGNOSIS — R0789 Other chest pain: Secondary | ICD-10-CM | POA: Insufficient documentation

## 2020-10-15 NOTE — Assessment & Plan Note (Addendum)
This can happen at rest, it can happen with exertion.  It does not always happen with exertion and is not clearly anginal in the way she describes it.  No chest pain on exam now.  She said some burping with relief with PPI versus H2 blocker.  She noticed heart rate variation and I suspect some of that is related to bigeminy where not all beats were detected on the monitor.  If she has tachycardia that it is reasonable to use propranolol.  Reasonable to check routine labs in the meantime.  She is chest pain-free and okay for outpatient follow-up.  Refer to cardiology.  EKG discussed with patient.  In bigeminy has expected based on exam.  35 minutes were devoted to patient care in this encounter (this includes time spent reviewing the patient's file/history, interviewing and examining the patient, counseling/reviewing plan with patient).

## 2020-10-17 ENCOUNTER — Telehealth: Payer: Self-pay | Admitting: Family Medicine

## 2020-10-17 NOTE — Telephone Encounter (Signed)
Patient would like you to call her about her lab results. EM

## 2020-10-17 NOTE — Telephone Encounter (Signed)
Spoke with patient about lab results and patient verbalized understanding. Patient will await call from cardiology to schedule.

## 2020-11-15 ENCOUNTER — Other Ambulatory Visit: Payer: Self-pay

## 2020-11-15 ENCOUNTER — Ambulatory Visit: Payer: 59 | Admitting: Cardiology

## 2020-11-15 ENCOUNTER — Encounter: Payer: Self-pay | Admitting: Cardiology

## 2020-11-15 VITALS — BP 126/76 | HR 105 | Temp 98.1°F | Resp 16 | Ht 68.0 in | Wt 173.4 lb

## 2020-11-15 DIAGNOSIS — E785 Hyperlipidemia, unspecified: Secondary | ICD-10-CM

## 2020-11-15 DIAGNOSIS — R011 Cardiac murmur, unspecified: Secondary | ICD-10-CM

## 2020-11-15 DIAGNOSIS — E559 Vitamin D deficiency, unspecified: Secondary | ICD-10-CM

## 2020-11-15 DIAGNOSIS — I471 Supraventricular tachycardia: Secondary | ICD-10-CM

## 2020-11-15 DIAGNOSIS — R002 Palpitations: Secondary | ICD-10-CM

## 2020-11-15 MED ORDER — PROPRANOLOL HCL ER 60 MG PO CP24: 60.0000 mg | ORAL_CAPSULE | Freq: Every day | ORAL | 2 refills | Status: DC

## 2020-11-15 NOTE — Progress Notes (Signed)
Primary Physician/Referring:  Joaquim Nam, MD  Patient ID: Kathy Avila, female    DOB: Mar 24, 1961, 60 y.o.   MRN: [id number]  Chief Complaint  Patient presents with   Heart Murmur   New Patient (Initial Visit)   HPI:    Kathy Avila  is a 60 y.o. Caucasian female patient with palpitations that started about 2 to 3 months ago, had COVID-19 infection in January 2022.  She also has hyperlipidemia and hypovitaminosis D.  Palpitations described as rapid heartbeat that last a few minutes comes on with or without activity, sometimes last followed off throughout the day but not continuous.  Sometimes associated with discomfort in the chest and dyspnea.  She has also noticed fatigue since COVID-19 infection.  Past Medical History:  Diagnosis Date   Anxiety    B12 deficiency    Coronary artery disease    Depression    GERD (gastroesophageal reflux disease)    Headache    Hyperlipidemia    Menopausal symptoms    PONV (postoperative nausea and vomiting)    Wears contact lenses    Past Surgical History:  Procedure Laterality Date   ANKLE RECONSTRUCTION Bilateral    ANTERIOR CERVICAL DECOMPRESSION/DISCECTOMY FUSION 4 LEVELS N/A 01/09/2018   Procedure: CERVICAL THREE-FOUR, CERVICAL FOUR-FIVE, CERVICAL FIVE-SIX, CERVICAL SIX-SEVEN ANTERIOR CERVICAL DECOMPRESSION/DISCECTOMY FUSION;  Surgeon: Tia Alert, MD;  Location: [facility] OR;  Service: Neurosurgery;  Laterality: N/A;   CARDIAC CATHETERIZATION N/A 09/06/2015   Procedure: Left Heart Cath and Coronary Angiography;  Surgeon: Lamar Blinks, MD;  Location: [facility] INVASIVE CV LAB;  Service: Cardiovascular;  Laterality: N/A;   DILATION AND CURETTAGE OF UTERUS     TONSILLECTOMY     Family History  Problem Relation Age of Onset   Cirrhosis Mother        not from etoh per patient report   Throat cancer Father    Breast cancer Neg Hx    Colon cancer Neg Hx     Social History   Tobacco Use   Smoking status: Never    Smokeless tobacco: Never  Substance Use Topics   Alcohol use: Yes    Alcohol/week: 1.0 standard drink    Types: 1 Glasses of wine per week    Comment: occ/weekends   Marital Status: Divorced  ROS  Review of Systems  Constitutional: Positive for malaise/fatigue.  Cardiovascular:  Positive for palpitations. Negative for chest pain, dyspnea on exertion and leg swelling.  Gastrointestinal:  Negative for melena.  Objective  Blood pressure 126/76, pulse (!) 105, temperature 98.1 F (36.7 C), temperature source Temporal, resp. rate 16, height 5\' 8"  (1.727 m), weight 173 lb 6.4 oz (78.7 kg), SpO2 96 %. Body mass index is 26.37 kg/m.  Vitals with BMI 11/15/2020 10/13/2020 01/27/2020  Height 5\' 8"  5\' 8"  5\' 8"   Weight 173 lbs 6 oz 182 lbs 186 lbs 8 oz  BMI 26.37 27.68 28.36  Systolic 126 124 01/29/2020  Diastolic 76 78 84  Pulse 105 134 97    Physical Exam Neck:     Vascular: No carotid bruit or JVD.  Cardiovascular:     Rate and Rhythm: Normal rate and regular rhythm.     Pulses: Intact distal pulses.     Heart sounds: Normal heart sounds. No murmur heard.   No gallop.  Pulmonary:     Effort: Pulmonary effort is normal.     Breath sounds: Normal breath sounds.  Abdominal:     General:  Bowel sounds are normal.     Palpations: Abdomen is soft.  Musculoskeletal:        General: No swelling.  Skin:    General: Skin is warm.     Capillary Refill: Capillary refill takes less than 2 seconds.  Neurological:     General: No focal deficit present.    Laboratory examination:   Recent Labs    12/29/19 1440 01/06/20 0000 10/13/20 1626  NA 137  --  139  K 3.1*  --  4.3  CL 102  --  103  CO2 24  --  24  GLUCOSE 115*  --  94  BUN <5*  --  13  CREATININE 0.81 1.2* 0.96  CALCIUM 9.3  --  9.6  GFRNONAA >60  --   --   GFRAA >60  --   --    CrCl cannot be calculated (Patient's most recent lab result is older than the maximum 21 days allowed.).  CMP Latest Ref Rng & Units 10/13/2020 01/06/2020  12/29/2019  Glucose 65 - 99 mg/dL 94 - 409(W)  BUN 8 - 27 mg/dL 13 - <1(X)  Creatinine 0.57 - 1.00 mg/dL 9.14 1.2(A) 0.81  Sodium 134 - 144 mmol/L 139 - 137  Potassium 3.5 - 5.2 mmol/L 4.3 - 3.1(L)  Chloride 96 - 106 mmol/L 103 - 102  CO2 20 - 29 mmol/L 24 - 24  Calcium 8.7 - 10.3 mg/dL 9.6 - 9.3  Total Protein 6.0 - 8.5 g/dL 6.9 - -  Total Bilirubin 0.0 - 1.2 mg/dL 0.2 - -  Alkaline Phos 44 - 121 IU/L 100 - -  AST 0 - 40 IU/L 15 - -  ALT 0 - 32 IU/L 13 - -   CBC Latest Ref Rng & Units 10/13/2020 12/29/2019 01/01/2018  WBC 3.4 - 10.8 x10E3/uL 9.6 9.8 9.2  Hemoglobin 11.1 - 15.9 g/dL 78.2 95.6 16.1(H)  Hematocrit 34.0 - 46.6 % 45.5 42.0 51.8(H)  Platelets 150 - 450 x10E3/uL 268 290 244   Lipid Panel Recent Labs    01/06/20 0000  CHOL 190  TRIG 111  LDLCALC 114  HDL 56   Lipid Panel     Component Value Date/Time   CHOL 190 01/06/2020 0000   CHOL 150 12/17/2017 0000   TRIG 111 01/06/2020 0000   TRIG 99 12/17/2017 0000   HDL 56 01/06/2020 0000   CHOLHDL 2.3 09/06/2015 0841   VLDL 17 09/06/2015 0841   LDLCALC 114 01/06/2020 0000   LDLCALC 76 12/17/2017 0000    HEMOGLOBIN A1C Lab Results  Component Value Date   HGBA1C 5.4 01/06/2020   TSH Recent Labs    10/13/20 1626  TSH 2.050   Medications and allergies   Allergies  Allergen Reactions   Venlafaxine     fatigue    Medication prior to this encounter:   Outpatient Medications Prior to Visit  Medication Sig Dispense Refill   atorvastatin (LIPITOR) 10 MG tablet Take 1 tablet (10 mg total) by mouth daily. 90 tablet 3   buPROPion (WELLBUTRIN XL) 300 MG 24 hr tablet TAKE 1 TABLET BY MOUTH  DAILY 30 tablet 11   cholecalciferol (VITAMIN D) 1000 units tablet Take 1,000 Units by mouth daily.     Cyanocobalamin 50 MCG LOZG Take by mouth.     estradiol (VIVELLE-DOT) 0.1 MG/24HR patch Place 1 patch onto the skin every 3 (three) days.      famotidine (PEPCID) 20 MG tablet Take 20 mg by mouth 2 (two)  times daily.      meloxicam (MOBIC) 7.5 MG tablet Take 7.5 mg by mouth 2 (two) times daily.     Multiple Vitamin (MULTIVITAMIN WITH MINERALS) TABS tablet Take 1 tablet by mouth daily.     progesterone (PROMETRIUM) 200 MG capsule Take 200 mg by mouth at bedtime.     propranolol (INDERAL) 10 MG tablet Take 1 tablet (10 mg total) by mouth 2 (two) times daily as needed (for fast heart rate). 60 tablet 1   tiZANidine (ZANAFLEX) 2 MG tablet Take 2 mg by mouth 3 (three) times daily.     vitamin E 400 UNIT capsule Take 400 Units by mouth daily.     HYDROcodone-acetaminophen (NORCO/VICODIN) 5-325 MG tablet Take 1-2 tablets by mouth every 8 (eight) hours as needed. (Patient not taking: Reported on 11/15/2020)     No facility-administered medications prior to visit.    FINAL MEDICATION AS OF TODAY:   Medications after current encounter Current Outpatient Medications  Medication Instructions   atorvastatin (LIPITOR) 10 mg, Oral, Daily   buPROPion (WELLBUTRIN XL) 300 MG 24 hr tablet TAKE 1 TABLET BY MOUTH  DAILY   cholecalciferol (VITAMIN D) 1,000 Units, Oral, Daily   Cyanocobalamin 50 MCG LOZG Oral   estradiol (VIVELLE-DOT) 0.1 MG/24HR patch 1 patch, Transdermal, every 72 hours   famotidine (PEPCID) 20 mg, Oral, 2 times daily   meloxicam (MOBIC) 7.5 mg, Oral, 2 times daily   Multiple Vitamin (MULTIVITAMIN WITH MINERALS) TABS tablet 1 tablet, Oral, Daily   progesterone (PROMETRIUM) 200 mg, Oral, Daily at bedtime   propranolol (INDERAL) 10 mg, Oral, 2 times daily PRN   propranolol ER (INDERAL LA) 60 mg, Oral, Daily   tiZANidine (ZANAFLEX) 2 mg, Oral, 3 times daily   vitamin E 400 Units, Oral, Daily    Radiology:   No results found.  Cardiac Studies:   Coronary angiogram 09/06/2015: Normal LV systolic function, normal coronary arteries.  Treadmill exercise stress test 09/06/2015: Exercise duration 3 minutes and achieved 4.6 METS.  Stress test terminated due to dyspnea.  There was no ST-T wave changes of ischemia.   Markedly reduced exercise tolerance.  EKG:   EKG 11/15/2020: Predominantly sinus rhythm, ventricular rate 102 bpm.  Frequent PACs and couplets and triplets noted.  No evidence of ischemia.    Assessment     ICD-10-CM   1. Cardiac murmur  R01.1 EKG 12-Lead    PCV ECHOCARDIOGRAM COMPLETE    2. Palpitations  R00.2     3. Atrial tachycardia (HCC)  I47.1 PCV ECHOCARDIOGRAM COMPLETE    propranolol ER (INDERAL LA) 60 MG 24 hr capsule    4. Mild hyperlipidemia  E78.5     5. Hypovitaminosis D  E55.9 VITAMIN D 25 Hydroxy (Vit-D Deficiency, Fractures)     Medications Discontinued During This Encounter  Medication Reason   HYDROcodone-acetaminophen (NORCO/VICODIN) 5-325 MG tablet No longer needed (for PRN medications)   (Patient not taking) Meds ordered this encounter  Medications   propranolol ER (INDERAL LA) 60 MG 24 hr capsule    Sig: Take 1 capsule (60 mg total) by mouth daily.    Dispense:  30 capsule    Refill:  2    Orders Placed This Encounter  Procedures   VITAMIN D 25 Hydroxy (Vit-D Deficiency, Fractures)   EKG 12-Lead   PCV ECHOCARDIOGRAM COMPLETE    Standing Status:   Future    Standing Expiration Date:   11/15/2021   Recommendations:   Kathy Leverserri Lea H Ormond is  a 60 y.o. Caucasian female patient with palpitations that started about 2 to 3 months ago, had COVID-19 infection in January 2022.  She also has hyperlipidemia and hypovitaminosis D.   Her last hospitalization in [place] with chest pain in 2017, she had poor exercise tolerance and cardiac catheterization that revealed normal coronary arteries.  Since being on low-dose propranolol, symptoms of responded and she takes propranolol on a as needed basis.  I will also add a long-acting propranolol ER 60 mg daily, discussed regarding potential for weight gain as a side effect.  Obtain an echocardiogram, to evaluate chamber sizes and function.  Hopefully beta-blocker therapy will also help with atrial tachycardia and  frequent PACs.  In view of continued fatigue since COVID-19, check vitamin D level as she has history of vitamin D deficiency and is presently not on supplements.  She is got mild hyperlipidemia and presently on low-dose statin, has follow-up with her PCP for this.  She has mildly abnormal MRI of the brain, most likely suspect migraine related changes but may be microvascular as well.  I will see her back in 6 weeks for follow-up of palpitations along with repeat EKG and to follow-up on the echocardiogram.  This was a 60-minute office visit encounter for consultation regarding underlying medical conditions and review of medical records.    Yates Decamp, MD, [facility] 11/15/2020, 9:18 PM Office: [phone]

## 2020-11-17 ENCOUNTER — Other Ambulatory Visit: Payer: Self-pay | Admitting: Family Medicine

## 2021-01-05 ENCOUNTER — Ambulatory Visit: Payer: 59

## 2021-01-05 ENCOUNTER — Other Ambulatory Visit: Payer: Self-pay

## 2021-01-05 DIAGNOSIS — R011 Cardiac murmur, unspecified: Secondary | ICD-10-CM

## 2021-01-05 DIAGNOSIS — I4719 Other supraventricular tachycardia: Secondary | ICD-10-CM

## 2021-01-05 DIAGNOSIS — I471 Supraventricular tachycardia: Secondary | ICD-10-CM

## 2021-01-27 ENCOUNTER — Other Ambulatory Visit: Payer: Self-pay | Admitting: Family Medicine

## 2021-02-14 ENCOUNTER — Telehealth: Payer: Self-pay

## 2021-02-14 NOTE — Telephone Encounter (Signed)
[facility] - Client TELEPHONE ADVICE RECORD [employer] Patient Name: Kathy Avila OVER BEY Gender: Female DOB: July 27, 1960 Age: 60 Y 7 M 29 D Return Phone Number: [phone] (Primary), [phone] (Secondary) Address: City/ State/ Zip: [place] [address] Client [facility] - Client Client Site [facility] - Day Physician Raechel Ache - MD Contact Type Call Who Is Calling Patient / Member / Family / Caregiver Call Type Triage / Clinical Relationship To Patient Self Return Phone Number [phone] (Primary) Chief Complaint Rash - Widespread Reason for Call Symptomatic / Request for Health Information Initial Comment Caller states she has a rash on her face and neck. States it itches really bad on her neck. Translation No Nurse Assessment Nurse: Chilton Si, RN, Benetta Spar Date/Time (Eastern Time): 02/14/2021 11:47:56 AM Confirm and document reason for call. If symptomatic, describe symptoms. ---Caller states two days ago she noticed a redness to the side of her face and neck continuous itching Does the patient have any new or worsening symptoms? ---Yes Will a triage be completed? ---Yes Related visit to physician within the last 2 weeks? ---No Does the PT have any chronic conditions? (i.e. diabetes, asthma, this includes High risk factors for pregnancy, etc.) ---No Is this a behavioral health or substance abuse call? ---No Guidelines Guideline Title Affirmed Question Affirmed Notes Nurse Date/Time (Eastern Time) Rash or Redness - Localized Localized rash present > 7 days Chilton Si, RN, [place] 02/14/2021 11:50:20 AM Disp. Time Lamount Cohen Time) Disposition Final User 02/14/2021 11:54:22 AM SEE PCP WITHIN 3 DAYS Yes Chilton Si, RN, Matthew Saras Disagree/Comply Comply Caller Understands Yes PreDisposition Home Care PLEASE NOTE: All timestamps contained within this report are represented as [place] Standard  Time. CONFIDENTIALTY NOTICE: This fax transmission is intended only for the addressee. It contains information that is legally privileged, confidential or otherwise protected from use or disclosure. If you are not the intended recipient, you are strictly prohibited from reviewing, disclosing, copying using or disseminating any of this information or taking any action in reliance on or regarding this information. If you have received this fax in error, please notify us immediately by telephone so that we can arrange for its return to [place]. Phone: [phone], Toll-Free: [phone], Fax: [phone] Page: 2 of 2 Call Id: [id number] Care Advice Given Per Guideline SEE PCP WITHIN 3 DAYS: * You need to be seen within 2 or 3 days. CALL BACK IF: * Rash becomes worse. CARE ADVICE given per Rash - Localized and Cause Unknown (Adult) guideline.

## 2021-02-14 NOTE — Telephone Encounter (Signed)
Noted. Thanks.

## 2021-02-14 NOTE — Telephone Encounter (Signed)
I spoke with pt and offered pt an appt for 02/15/21; pt already has appt on 02/16/21 but due to transportation issues pt said will keep 02/16/21 appt.pt said no blisters and UC & ED precautions given and pt voiced understanding. Sending note to Dr Para March and Audria Nine NP.

## 2021-02-16 ENCOUNTER — Ambulatory Visit: Payer: 59 | Admitting: Nurse Practitioner

## 2021-02-16 ENCOUNTER — Other Ambulatory Visit: Payer: Self-pay

## 2021-02-16 VITALS — BP 134/80 | HR 84 | Temp 96.8°F | Resp 14 | Ht 68.0 in | Wt 182.1 lb

## 2021-02-16 DIAGNOSIS — Z23 Encounter for immunization: Secondary | ICD-10-CM | POA: Insufficient documentation

## 2021-02-16 DIAGNOSIS — L245 Irritant contact dermatitis due to other chemical products: Secondary | ICD-10-CM | POA: Insufficient documentation

## 2021-02-16 MED ORDER — TRIAMCINOLONE ACETONIDE 0.025 % EX CREA: 1.0000 "application " | TOPICAL_CREAM | Freq: Two times a day (BID) | CUTANEOUS | 0 refills | Status: AC

## 2021-02-16 NOTE — Patient Instructions (Signed)
Nice to see you Let me know if this does not help

## 2021-02-16 NOTE — Progress Notes (Signed)
Acute Office Visit  Subjective:    Patient ID: Kathy Avila, female    DOB: 20-Jan-1961, 60 y.o.   MRN: [id number]  Chief Complaint  Patient presents with   Rash    X 3 days. Cheeks bilateral, neck. Red and itchy/tingling sensation.     HPI Patient is in today for rash  States that she 3 days ago and it was on both cheeks and throat. Reddness tightness. And itching.   Reddness and itching now. States some tightness. Neck portion hs resolved mostly. She has been trying OTC hydrocortisone cream without great relief  Past Medical History:  Diagnosis Date   Anxiety    B12 deficiency    Coronary artery disease    Depression    GERD (gastroesophageal reflux disease)    Headache    Hyperlipidemia    Menopausal symptoms    PONV (postoperative nausea and vomiting)    Wears contact lenses     Past Surgical History:  Procedure Laterality Date   ANKLE RECONSTRUCTION Bilateral    ANTERIOR CERVICAL DECOMPRESSION/DISCECTOMY FUSION 4 LEVELS N/A 01/09/2018   Procedure: CERVICAL THREE-FOUR, CERVICAL FOUR-FIVE, CERVICAL FIVE-SIX, CERVICAL SIX-SEVEN ANTERIOR CERVICAL DECOMPRESSION/DISCECTOMY FUSION;  Surgeon: Eustace Moore, MD;  Location: [place];  Service: Neurosurgery;  Laterality: N/A;   CARDIAC CATHETERIZATION N/A 09/06/2015   Procedure: Left Heart Cath and Coronary Angiography;  Surgeon: Corey Skains, MD;  Location: [facility];  Service: Cardiovascular;  Laterality: N/A;   DILATION AND CURETTAGE OF UTERUS     TONSILLECTOMY      Family History  Problem Relation Age of Onset   Cirrhosis Mother        not from etoh per patient report   Throat cancer Father    Breast cancer Neg Hx    Colon cancer Neg Hx     Social History   Socioeconomic History   Marital status: Divorced    Spouse name: Not on file   Number of children: 2   Years of education: Not on file   Highest education level: Not on file  Occupational History   Not on file  Tobacco Use   Smoking  status: Never   Smokeless tobacco: Never  Vaping Use   Vaping Use: Never used  Substance and Sexual Activity   Alcohol use: Yes    Alcohol/week: 1.0 standard drink    Types: 1 Glasses of wine per week    Comment: occ/weekends   Drug use: No   Sexual activity: Not on file  Other Topics Concern   Not on file  Social History Narrative   Divorced.   2 grown daughters. Both out of town.   Lives with her boyfriend, Christophe Louis   Works at [employer], Aeronautical engineer.    Social Determinants of Health   Financial Resource Strain: Not on file  Food Insecurity: Not on file  Transportation Needs: Not on file  Physical Activity: Not on file  Stress: Not on file  Social Connections: Not on file  Intimate Partner Violence: Not on file    Outpatient Medications Prior to Visit  Medication Sig Dispense Refill   atorvastatin (LIPITOR) 10 MG tablet Take 1 tablet (10 mg total) by mouth daily. 90 tablet 3   buPROPion (WELLBUTRIN XL) 300 MG 24 hr tablet TAKE 1 TABLET BY MOUTH  DAILY 30 tablet 4   cholecalciferol (VITAMIN D) 1000 units tablet Take 1,000 Units by mouth daily.     Cyanocobalamin 50 MCG  LOZG Take by mouth.     estradiol (VIVELLE-DOT) 0.1 MG/24HR patch Place 1 patch onto the skin every 3 (three) days.      famotidine (PEPCID) 20 MG tablet Take 20 mg by mouth 2 (two) times daily.     meloxicam (MOBIC) 7.5 MG tablet Take 7.5 mg by mouth 2 (two) times daily.     Multiple Vitamin (MULTIVITAMIN WITH MINERALS) TABS tablet Take 1 tablet by mouth daily.     progesterone (PROMETRIUM) 200 MG capsule Take 200 mg by mouth at bedtime.     propranolol ER (INDERAL LA) 60 MG 24 hr capsule Take 1 capsule (60 mg total) by mouth daily. 30 capsule 2   tiZANidine (ZANAFLEX) 2 MG tablet Take 2 mg by mouth 3 (three) times daily.     vitamin E 400 UNIT capsule Take 400 Units by mouth daily.     propranolol (INDERAL) 10 MG tablet Take 1 tablet (10 mg total) by mouth 2 (two) times daily as needed (for fast  heart rate). (Patient not taking: Reported on 02/16/2021) 60 tablet 1   No facility-administered medications prior to visit.    Allergies  Allergen Reactions   Venlafaxine     fatigue    Review of Systems  Constitutional:  Negative for chills and fever.  HENT:  Negative for trouble swallowing.   Respiratory:  Negative for cough and shortness of breath.   Cardiovascular:  Negative for chest pain.  Gastrointestinal:  Negative for nausea and vomiting.  Skin:  Positive for color change and rash.      Objective:    Physical Exam Vitals and nursing note reviewed.  Constitutional:      Appearance: Normal appearance.  Cardiovascular:     Rate and Rhythm: Normal rate and regular rhythm.  Pulmonary:     Effort: Pulmonary effort is normal.     Breath sounds: Normal breath sounds.  Skin:    General: Skin is warm.     Findings: Rash present. Rash is macular.     Comments: Rash to bilateral cheeks. Erythema present. Patient reports pruritis. See phone. Spared her lower lid and eye.  Neurological:     Mental Status: She is alert.       BP 134/80   Pulse 84   Temp (!) 96.8 F (36 C)   Resp 14   Ht 5' 8" (1.727 m)   Wt 182 lb 2 oz (82.6 kg)   SpO2 96%   BMI 27.69 kg/m  Wt Readings from Last 3 Encounters:  02/16/21 182 lb 2 oz (82.6 kg)  11/15/20 173 lb 6.4 oz (78.7 kg)  10/13/20 182 lb (82.6 kg)    Health Maintenance Due  Topic Date Due   HIV Screening  Never done   Hepatitis C Screening  Never done   TETANUS/TDAP  Never done   Zoster Vaccines- Shingrix (1 of 2) Never done   COLONOSCOPY (Pts 45-70yr Insurance coverage will need to be confirmed)  Never done   MAMMOGRAM  05/08/2018   PAP SMEAR-Modifier  07/17/2019   COVID-19 Vaccine (3 - Pfizer risk series) 09/28/2019   INFLUENZA VACCINE  12/11/2020    There are no preventive care reminders to display for this patient.   Lab Results  Component Value Date   TSH 2.050 10/13/2020   Lab Results  Component Value  Date   WBC 9.6 10/13/2020   HGB 14.5 10/13/2020   HCT 45.5 10/13/2020   MCV 79 10/13/2020   PLT 268 10/13/2020  Lab Results  Component Value Date   NA 139 10/13/2020   K 4.3 10/13/2020   CO2 24 10/13/2020   GLUCOSE 94 10/13/2020   BUN 13 10/13/2020   CREATININE 0.96 10/13/2020   BILITOT 0.2 10/13/2020   ALKPHOS 100 10/13/2020   AST 15 10/13/2020   ALT 13 10/13/2020   PROT 6.9 10/13/2020   ALBUMIN 4.4 10/13/2020   CALCIUM 9.6 10/13/2020   ANIONGAP 11 12/29/2019   EGFR 67 10/13/2020   Lab Results  Component Value Date   CHOL 190 01/06/2020   Lab Results  Component Value Date   HDL 56 01/06/2020   Lab Results  Component Value Date   LDLCALC 114 01/06/2020   Lab Results  Component Value Date   TRIG 111 01/06/2020   Lab Results  Component Value Date   CHOLHDL 2.3 09/06/2015   Lab Results  Component Value Date   HGBA1C 5.4 01/06/2020       Assessment & Plan:   Problem List Items Addressed This Visit       Musculoskeletal and Integument   Irritant contact dermatitis due to other chemical products - Primary    BillingPatient presents with 3 days of bilateral rash on cheeks.  States it started after she started using a new lady cream for her face.  Started on bilateral cheeks nasolabial fold and neck.  Neck has resolved with bilateral cheeks and nasolabial folds.  Patient was concerned for rosacea but given that it was after use and was on her neck low likelihood.  Will use a prescription low potency steroid sparingly over the next week.  Patient is also to use a good face moisturizer at night.  She is to limit make-up and substances on her face.  Advised avoiding use of that same cream that is named the culprit.  Will notify office if not improving.  Did review signs and symptoms of use of steroid cream.      Relevant Medications   triamcinolone (KENALOG) 0.025 % cream     Other   Need for immunization against influenza   Relevant Orders   Flu Vaccine QUAD  6+ mos PF IM (Fluarix Quad PF)     No orders of the defined types were placed in this encounter.  This visit occurred during the SARS-CoV-2 public health emergency.  Safety protocols were in place, including screening questions prior to the visit, additional usage of staff PPE, and extensive cleaning of exam room while observing appropriate contact time as indicated for disinfecting solutions.   Romilda Garret, NP

## 2021-02-16 NOTE — Assessment & Plan Note (Signed)
BillingPatient presents with 3 days of bilateral rash on cheeks.  States it started after she started using a new lady cream for her face.  Started on bilateral cheeks nasolabial fold and neck.  Neck has resolved with bilateral cheeks and nasolabial folds.  Patient was concerned for rosacea but given that it was after use and was on her neck low likelihood.  Will use a prescription low potency steroid sparingly over the next week.  Patient is also to use a good face moisturizer at night.  She is to limit make-up and substances on her face.  Advised avoiding use of that same cream that is named the culprit.  Will notify office if not improving.  Did review signs and symptoms of use of steroid cream.

## 2021-03-06 ENCOUNTER — Other Ambulatory Visit: Payer: Self-pay

## 2021-03-06 MED ORDER — ATORVASTATIN CALCIUM 10 MG PO TABS
10.0000 mg | ORAL_TABLET | Freq: Every day | ORAL | 3 refills | Status: DC
Start: 2021-03-06 — End: 2021-03-27

## 2021-03-19 ENCOUNTER — Other Ambulatory Visit: Payer: Self-pay | Admitting: Otolaryngology

## 2021-03-19 DIAGNOSIS — H9041 Sensorineural hearing loss, unilateral, right ear, with unrestricted hearing on the contralateral side: Secondary | ICD-10-CM

## 2021-03-26 ENCOUNTER — Other Ambulatory Visit: Payer: Self-pay | Admitting: Family Medicine

## 2021-03-26 ENCOUNTER — Other Ambulatory Visit: Payer: Self-pay | Admitting: Cardiology

## 2021-03-26 DIAGNOSIS — I471 Supraventricular tachycardia: Secondary | ICD-10-CM

## 2021-03-27 ENCOUNTER — Ambulatory Visit: Payer: 59

## 2021-03-27 ENCOUNTER — Other Ambulatory Visit: Payer: Self-pay

## 2021-04-20 ENCOUNTER — Other Ambulatory Visit: Payer: Self-pay | Admitting: Family Medicine

## 2021-06-15 ENCOUNTER — Other Ambulatory Visit: Payer: Self-pay | Admitting: Family Medicine

## 2021-07-24 ENCOUNTER — Other Ambulatory Visit: Payer: Self-pay | Admitting: Cardiology

## 2021-07-24 DIAGNOSIS — I471 Supraventricular tachycardia: Secondary | ICD-10-CM

## 2021-09-28 ENCOUNTER — Ambulatory Visit (INDEPENDENT_AMBULATORY_CARE_PROVIDER_SITE_OTHER)
Admission: RE | Admit: 2021-09-28 | Discharge: 2021-09-28 | Disposition: A | Discharge status: Home / Self Care | Payer: 59 | Source: Ambulatory Visit | Attending: Family Medicine | Admitting: Family Medicine

## 2021-09-28 ENCOUNTER — Encounter: Payer: Self-pay | Admitting: Family Medicine

## 2021-09-28 ENCOUNTER — Ambulatory Visit: Payer: 59 | Admitting: Family Medicine

## 2021-09-28 VITALS — BP 140/78 | HR 79 | Temp 98.0°F | Ht 68.0 in | Wt 198.0 lb

## 2021-09-28 DIAGNOSIS — R053 Chronic cough: Secondary | ICD-10-CM | POA: Diagnosis not present

## 2021-09-28 DIAGNOSIS — R051 Acute cough: Secondary | ICD-10-CM | POA: Insufficient documentation

## 2021-09-28 MED ORDER — BENZONATATE 200 MG PO CAPS: 200.0000 mg | ORAL_CAPSULE | Freq: Three times a day (TID) | ORAL | 1 refills | Status: DC | PRN

## 2021-09-28 MED ORDER — FAMOTIDINE 20 MG PO TABS: 20.0000 mg | ORAL_TABLET | Freq: Two times a day (BID) | ORAL | 3 refills | Status: DC

## 2021-09-28 MED ORDER — PREDNISONE 10 MG PO TABS: ORAL_TABLET | ORAL | 0 refills | Status: DC

## 2021-09-28 NOTE — Progress Notes (Signed)
Subjective:    Patient ID: Margrete Delude, female    DOB: [birth year], 61 y.o.   MRN: [id number]  HPI 61 yo pt of Dr Para March presents for c/o chronic cough   Wt Readings from Last 3 Encounters:  09/28/21 198 lb (89.8 kg)  02/16/21 182 lb 2 oz (82.6 kg)  11/15/20 173 lb 6.4 oz (78.7 kg)   30.11 kg/m  Had covid jan 2022  Ever since then she has a chronic deep cough  Makes her chest hurt at times   Mostly dry  Nose runs just a bit  Some wheezing/can hear herself  At times a little sob  Gets choked easily anyway since neck surgery   Never had asthma as a kid   No fever or new symptoms   Occ has colored mucous from nose   Does have allergy sinus problems  Post nasal drip  No medicines    Nyquil Mucinex Help a bit   Takes pepcid 20 mg bid for GERD-on med list , but not taking  Takes zegrid prn otc   DG Chest 2 View  Result Date: 09/29/2021 CLINICAL DATA:  Chronic cough with wheezing. EXAM: CHEST - 2 VIEW COMPARISON:  Chest x-ray 01/01/2018. FINDINGS: The heart size and mediastinal contours are within normal limits. Both lungs are clear. The visualized skeletal structures are unremarkable. Cervical spinal fusion plate is present. IMPRESSION: No active cardiopulmonary disease. Electronically Signed   By: Darliss Cheney M.D.   On: 09/29/2021 19:29     Patient Active Problem List   Diagnosis Date Noted   Chronic cough 09/28/2021   Need for immunization against influenza 02/16/2021   Irritant contact dermatitis due to other chemical products 02/16/2021   Chest discomfort 10/15/2020   Heart murmur 08/18/2020   Opioid dependence (HCC) 04/24/2020   Other long term (current) drug therapy 04/24/2020   Orthostatic hypotension 12/30/2019   Chronic low back pain 12/30/2019   Cerebral vascular disease 12/30/2019   Essential (primary) hypertension 10/26/2019   Pseudoarthrosis of lumbar spine 07/27/2019   Impingement syndrome of shoulder region 04/21/2018   Myofascial pain  04/21/2018   Shoulder pain 04/07/2018   Cervical vertebral fusion 01/09/2018   History of MMR vaccination 01/01/2018   Paresthesia 10/19/2017   Right lower quadrant abdominal pain 12/15/2016   Sacro-iliac pain 10/15/2016   Osteopenia 10/15/2016   Tachycardia 08/07/2016   Advance care planning 08/07/2016   Menopausal symptoms    GERD (gastroesophageal reflux disease)    B12 deficiency    Anxiety    Palpitations 07/16/2016   Depression, recurrent (HCC) 07/03/2007   Past Medical History:  Diagnosis Date   Anxiety    B12 deficiency    Coronary artery disease    Depression    GERD (gastroesophageal reflux disease)    Headache    Hyperlipidemia    Menopausal symptoms    PONV (postoperative nausea and vomiting)    Wears contact lenses    Past Surgical History:  Procedure Laterality Date   ANKLE RECONSTRUCTION Bilateral    ANTERIOR CERVICAL DECOMPRESSION/DISCECTOMY FUSION 4 LEVELS N/A 01/09/2018   Procedure: CERVICAL THREE-FOUR, CERVICAL FOUR-FIVE, CERVICAL FIVE-SIX, CERVICAL SIX-SEVEN ANTERIOR CERVICAL DECOMPRESSION/DISCECTOMY FUSION;  Surgeon: Tia Alert, MD;  Location: [facility];  Service: Neurosurgery;  Laterality: N/A;   CARDIAC CATHETERIZATION N/A 09/06/2015   Procedure: Left Heart Cath and Coronary Angiography;  Surgeon: Lamar Blinks, MD;  Location: [facility];  Service: Cardiovascular;  Laterality: N/A;   DILATION AND  CURETTAGE OF UTERUS     TONSILLECTOMY     Social History   Tobacco Use   Smoking status: Never   Smokeless tobacco: Never  Vaping Use   Vaping Use: Never used  Substance Use Topics   Alcohol use: Yes    Alcohol/week: 1.0 standard drink    Types: 1 Glasses of wine per week    Comment: occ/weekends   Drug use: No   Family History  Problem Relation Age of Onset   Cirrhosis Mother        not from etoh per patient report   Throat cancer Father    Breast cancer Neg Hx    Colon cancer Neg Hx    Allergies  Allergen Reactions    Venlafaxine     fatigue   Current Outpatient Medications on File Prior to Visit  Medication Sig Dispense Refill   atorvastatin (LIPITOR) 10 MG tablet TAKE 1 TABLET BY MOUTH ONCE A DAY 90 tablet 3   buPROPion (WELLBUTRIN XL) 300 MG 24 hr tablet TAKE 1 TABLET BY MOUTH DAILY 30 tablet 11   cholecalciferol (VITAMIN D) 1000 units tablet Take 1,000 Units by mouth daily.     Cyanocobalamin 50 MCG LOZG Take by mouth.     estradiol (VIVELLE-DOT) 0.1 MG/24HR patch Place 1 patch onto the skin every 3 (three) days.      meloxicam (MOBIC) 7.5 MG tablet Take 7.5 mg by mouth 2 (two) times daily.     Multiple Vitamin (MULTIVITAMIN WITH MINERALS) TABS tablet Take 1 tablet by mouth daily.     progesterone (PROMETRIUM) 200 MG capsule Take 200 mg by mouth at bedtime.     propranolol (INDERAL) 10 MG tablet Take 1 tablet (10 mg total) by mouth 2 (two) times daily as needed (for fast heart rate). 60 tablet 1   propranolol ER (INDERAL LA) 60 MG 24 hr capsule TAKE 1 CAPSULE BY MOUTH ONCE DAILY 30 capsule 2   tiZANidine (ZANAFLEX) 2 MG tablet Take 2 mg by mouth 3 (three) times daily.     vitamin E 400 UNIT capsule Take 400 Units by mouth daily.     No current facility-administered medications on file prior to visit.    Review of Systems  Constitutional:  Positive for fatigue. Negative for activity change, appetite change, fever and unexpected weight change.  HENT:  Negative for congestion, ear pain, rhinorrhea, sinus pressure and sore throat.   Eyes:  Negative for pain, redness and visual disturbance.  Respiratory:  Positive for cough, choking and wheezing. Negative for chest tightness, shortness of breath and stridor.   Cardiovascular:  Negative for chest pain and palpitations.  Gastrointestinal:  Negative for abdominal pain, blood in stool, constipation and diarrhea.  Endocrine: Negative for polydipsia and polyuria.  Genitourinary:  Negative for dysuria, frequency and urgency.  Musculoskeletal:  Negative for  arthralgias, back pain and myalgias.  Skin:  Negative for pallor and rash.  Allergic/Immunologic: Negative for environmental allergies.  Neurological:  Negative for dizziness, syncope and headaches.  Hematological:  Negative for adenopathy. Does not bruise/bleed easily.  Psychiatric/Behavioral:  Negative for decreased concentration and dysphoric mood. The patient is not nervous/anxious.       Objective:   Physical Exam Constitutional:      General: She is not in acute distress.    Appearance: Normal appearance. She is well-developed. She is obese. She is not ill-appearing or diaphoretic.  HENT:     Head: Normocephalic and atraumatic.  Eyes:  Conjunctiva/sclera: Conjunctivae normal.     Pupils: Pupils are equal, round, and reactive to light.  Neck:     Thyroid: No thyromegaly.     Vascular: No carotid bruit or JVD.  Cardiovascular:     Rate and Rhythm: Normal rate and regular rhythm.     Heart sounds: Normal heart sounds.    No gallop.  Pulmonary:     Effort: Pulmonary effort is normal. No respiratory distress.     Breath sounds: No stridor. Rhonchi present. No wheezing or rales.     Comments: Scattered rhonchi No prolonged exp phase   Not sob with exertion  Chest:     Chest wall: No tenderness.  Abdominal:     General: There is no distension or abdominal bruit.     Palpations: Abdomen is soft.  Musculoskeletal:     Cervical back: Normal range of motion and neck supple.     Right lower leg: No edema.     Left lower leg: No edema.  Lymphadenopathy:     Cervical: No cervical adenopathy.  Skin:    General: Skin is warm and dry.     Coloration: Skin is not pale.     Findings: No rash.  Neurological:     Mental Status: She is alert.     Coordination: Coordination normal.     Deep Tendon Reflexes: Reflexes are normal and symmetric. Reflexes normal.  Psychiatric:        Mood and Affect: Mood normal.          Assessment & Plan:   Problem List Items Addressed  This Visit       Other   Chronic cough - Primary    Has struggled with this since covid in 05/2020 occ gets choked when eating (due to neck surgery) No s/s of bacterial infection  Some reactive airways Some allergy symptoms and h/o GERD Reviewed old records today    cxr ordered and reviewed -normal/on pna inst to try zyrtec for pnd  pepcid for GERD Prednisone taper ordered  Tessalon for cough  Update if not starting to improve in a week or if worsening         Relevant Orders   DG Chest 2 View (Completed)

## 2021-09-28 NOTE — Patient Instructions (Addendum)
For runny nose you can try zyrtec (store brand) over the counter 10 mg daily at bedtime   Drink fluids  Take prednisone for the cough as directed It will make you feel hyper and hungry You may have some reactive airways -(like bronchitis)   Take generic pepcid 20 mg twice daily for at least a month    Chest xray today also  We will call you with the result   Try and avoid your husband's smoke

## 2021-09-30 NOTE — Assessment & Plan Note (Addendum)
Has struggled with this since covid in 05/2020 occ gets choked when eating (due to neck surgery) No s/s of bacterial infection  Some reactive airways Some allergy symptoms and h/o GERD Reviewed old records today    cxr ordered and reviewed -normal/on pna inst to try zyrtec for pnd  pepcid for GERD Prednisone taper ordered  Tessalon for cough  Update if not starting to improve in a week or if worsening

## 2021-10-22 ENCOUNTER — Telehealth: Payer: Self-pay | Admitting: *Deleted

## 2021-10-22 NOTE — Telephone Encounter (Signed)
PLEASE NOTE: All timestamps contained within this report are represented as [place] Standard Time. CONFIDENTIALTY NOTICE: This fax transmission is intended only for the addressee. It contains information that is legally privileged, confidential or otherwise protected from use or disclosure. If you are not the intended recipient, you are strictly prohibited from reviewing, disclosing, copying using or disseminating any of this information or taking any action in reliance on or regarding this information. If you have received this fax in error, please notify us immediately by telephone so that we can arrange for its return to [place]. Phone: [phone], Toll-Free: [phone], Fax: [phone] Page: 1 of 2 Call Id: [id number] [facility] Day - Client TELEPHONE ADVICE RECORD Access[employer]urse Patient Name: Kathy Avila OVER BEY Gender: Female DOB: Jul 07, 1960 Age: 61 Y 4 M 6 D Return Phone Number: [phone] (Primary) Address: City/ State/ Zip: [place] [address] Client [facility] Day - Client Client Site [facility] - Day Provider Raechel Ache - MD Contact Type Call Who Is Calling Patient / Member / Family / Caregiver Call Type Triage / Clinical Relationship To Patient Self Return Phone Number [phone] (Primary) Chief Complaint Dizziness Reason for Call Symptomatic / Request for Health Information Initial Comment Caller states she is seeing spots in her vision and has been extremely dizzy. The room was spinning and she was unable to get up. Translation No Nurse Assessment Nurse: Freida Busman, RN, Jonette Eva Date/Time (Eastern Time): 10/22/2021 10:53:24 AM Confirm and document reason for call. If symptomatic, describe symptoms. ---Caller states spots in her vision nausea and indigestion and she has vomited room was spinning and she staggered with trying to walk had 4/5 spells of this spots and so forth last one was 8am no fever but did  feel cold last night feels very tired Does the patient have any new or worsening symptoms? ---Yes Will a triage be completed? ---Yes Related visit to physician within the last 2 weeks? ---No Does the PT have any chronic conditions? (i.e. diabetes, asthma, this includes High risk factors for pregnancy, etc.) ---No Is this a behavioral health or substance abuse call? ---No Guidelines Guideline Title Affirmed Question Affirmed Notes Nurse Date/Time (Eastern Time) Dizziness - Vertigo [1] NO dizziness now AND [2] age > 63 Dalton, RN, [facility] 10/22/2021 10:56:31 AM Disp. Time Lamount Cohen Time) Disposition Final User 10/22/2021 11:04:39 AM Send To Clinical Follow Up Lorane Gell, RN, Ladona Ridgel 10/22/2021 11:02:07 AM See PCP within 24 Hours Yes Dalton, RN, Jonette Eva PLEASE NOTE: All timestamps contained within this report are represented as [place] Standard Time. CONFIDENTIALTY NOTICE: This fax transmission is intended only for the addressee. It contains information that is legally privileged, confidential or otherwise protected from use or disclosure. If you are not the intended recipient, you are strictly prohibited from reviewing, disclosing, copying using or disseminating any of this information or taking any action in reliance on or regarding this information. If you have received this fax in error, please notify us immediately by telephone so that we can arrange for its return to [place]. Phone: [phone], Toll-Free: [phone], Fax: [phone] Page: 2 of 2 Call Id: [id number] Caller Disagree/Comply Comply Caller Understands Yes PreDisposition Call Doctor Care Advice Given Per Guideline SEE PCP WITHIN 24 HOURS: * IF OFFICE WILL BE OPEN: You need to be examined within the next 24 hours. Call your doctor (or NP/PA) when the office opens and make an appointment. FALL PREVENTION: * Sit at side of bed for several minutes before standing up. Stand up  slowly. * Avoid sudden movements of head. CALL BACK IF:  * Severe headache occurs * Weakness develops in an arm or leg * Unable to walk without falling * Dizziness becomes constant * You become worse CARE ADVICE given per Dizziness - Vertigo (Adult) guideline. Comments User: Felipe Drone, RN Date/Time Lamount Cohen Time): 10/22/2021 11:09:36 AM apt scheduled for 915 tomorrow morning Referrals REFERRED TO PCP OFFICE

## 2021-10-22 NOTE — Telephone Encounter (Signed)
Spoke to patient by telephone and was advised that her symptoms started during the night. Patient stated that she started with dizziness, headache, and vomiting. Patient stated that she was seeing spots but denies blurred vision. Patient stated that she still has a headache and has taken Advil. Patient stated that she has not seen any spots since 8:00 am. Patient denies a fever, numbness or tingling.   Patient stated that she still has a headache, nausea, some dizziness and weakness.  Patient stated that she has never had vertigo. Patient is scheduled with Dr. Alphonsus Sias 10/23/21 at 9:15. Patient was given ER precautions and she verbalized understanding. Patient was advised that she needs to rest, drink fluids and avoid moving around too much today.

## 2021-10-22 NOTE — Telephone Encounter (Signed)
Okay---I will check her at tomorrow morning's visit

## 2021-10-22 NOTE — Telephone Encounter (Signed)
Noted. Thanks.

## 2021-10-23 ENCOUNTER — Encounter: Payer: Self-pay | Admitting: Internal Medicine

## 2021-10-23 ENCOUNTER — Ambulatory Visit: Payer: 59 | Admitting: Internal Medicine

## 2021-10-23 DIAGNOSIS — R0789 Other chest pain: Secondary | ICD-10-CM | POA: Diagnosis not present

## 2021-10-23 DIAGNOSIS — K219 Gastro-esophageal reflux disease without esophagitis: Secondary | ICD-10-CM

## 2021-10-23 DIAGNOSIS — R42 Dizziness and giddiness: Secondary | ICD-10-CM | POA: Insufficient documentation

## 2021-10-23 MED ORDER — MECLIZINE HCL 25 MG PO TABS: 25.0000 mg | ORAL_TABLET | Freq: Three times a day (TID) | ORAL | 1 refills | Status: DC | PRN

## 2021-10-23 MED ORDER — OMEPRAZOLE 20 MG PO CPDR: 20.0000 mg | DELAYED_RELEASE_CAPSULE | Freq: Every day | ORAL | 3 refills | Status: DC

## 2021-10-23 NOTE — Progress Notes (Signed)
Subjective:    Patient ID: Kathy Avila, female    DOB: 01-15-1961, 61 y.o.   MRN: [id number]  HPI Here due to dizziness  Started yesterday morning around 2AM---"hit me all of the sudden" Awoke and felt nauseated and indigestion Dizzy and lightheaded---seeing spots with any head movement and things spinning Got up and went to bathroom---threw up once Tried to get back to sleep---but scared with the symptoms with movement  Got pain and soreness in chest with movement Had catch in left shoulder also Felt like indigestion  In morning---still seeing the spots, off balance with walking Sick feeling on stomach---moving and at rest Did seem to ease up some--but still some dizziness when getting up and around 2 more episodes of spinning/spots yesterday and one this morning (?5 seconds)  Today--feels tired and drained but not as off balance  Some chest pain---sharp and brief (indigestion?) Is on propranolol for fast heart no SOB now  Current Outpatient Medications on File Prior to Visit  Medication Sig Dispense Refill   atorvastatin (LIPITOR) 10 MG tablet TAKE 1 TABLET BY MOUTH ONCE A DAY 90 tablet 3   benzonatate (TESSALON) 200 MG capsule Take 1 capsule (200 mg total) by mouth 3 (three) times daily as needed for cough. Do not bite pill 30 capsule 1   buPROPion (WELLBUTRIN XL) 300 MG 24 hr tablet TAKE 1 TABLET BY MOUTH DAILY 30 tablet 11   cholecalciferol (VITAMIN D) 1000 units tablet Take 1,000 Units by mouth daily.     Cyanocobalamin 50 MCG LOZG Take by mouth.     estradiol (VIVELLE-DOT) 0.1 MG/24HR patch Place 1 patch onto the skin every 3 (three) days.      famotidine (PEPCID) 20 MG tablet Take 1 tablet (20 mg total) by mouth 2 (two) times daily. 60 tablet 3   meloxicam (MOBIC) 7.5 MG tablet Take 7.5 mg by mouth 2 (two) times daily.     Multiple Vitamin (MULTIVITAMIN WITH MINERALS) TABS tablet Take 1 tablet by mouth daily.     progesterone (PROMETRIUM) 200 MG capsule Take  200 mg by mouth at bedtime.     propranolol (INDERAL) 10 MG tablet Take 1 tablet (10 mg total) by mouth 2 (two) times daily as needed (for fast heart rate). 60 tablet 1   propranolol ER (INDERAL LA) 60 MG 24 hr capsule TAKE 1 CAPSULE BY MOUTH ONCE DAILY 30 capsule 2   tiZANidine (ZANAFLEX) 2 MG tablet Take 2 mg by mouth 3 (three) times daily.     vitamin E 400 UNIT capsule Take 400 Units by mouth daily.     No current facility-administered medications on file prior to visit.    Allergies  Allergen Reactions   Venlafaxine     fatigue    Past Medical History:  Diagnosis Date   Anxiety    B12 deficiency    Coronary artery disease    Depression    GERD (gastroesophageal reflux disease)    Headache    Hyperlipidemia    Menopausal symptoms    PONV (postoperative nausea and vomiting)    Wears contact lenses     Past Surgical History:  Procedure Laterality Date   ANKLE RECONSTRUCTION Bilateral    ANTERIOR CERVICAL DECOMPRESSION/DISCECTOMY FUSION 4 LEVELS N/A 01/09/2018   Procedure: CERVICAL THREE-FOUR, CERVICAL FOUR-FIVE, CERVICAL FIVE-SIX, CERVICAL SIX-SEVEN ANTERIOR CERVICAL DECOMPRESSION/DISCECTOMY FUSION;  Surgeon: Tia Alert, MD;  Location: [facility] OR;  Service: Neurosurgery;  Laterality: N/A;   CARDIAC CATHETERIZATION N/A 09/06/2015  Procedure: Left Heart Cath and Coronary Angiography;  Surgeon: Corey Skains, MD;  Location: [place] [facility];  Service: Cardiovascular;  Laterality: N/A;   DILATION AND CURETTAGE OF UTERUS     TONSILLECTOMY      Family History  Problem Relation Age of Onset   Cirrhosis Mother        not from etoh per patient report   Throat cancer Father    Breast cancer Neg Hx    Colon cancer Neg Hx     Social History   Socioeconomic History   Marital status: Divorced    Spouse name: Not on file   Number of children: 2   Years of education: Not on file   Highest education level: Not on file  Occupational History   Not on file  Tobacco Use    Smoking status: Never   Smokeless tobacco: Never  Vaping Use   Vaping Use: Never used  Substance and Sexual Activity   Alcohol use: Yes    Alcohol/week: 1.0 standard drink of alcohol    Types: 1 Glasses of wine per week    Comment: occ/weekends   Drug use: No   Sexual activity: Not on file  Other Topics Concern   Not on file  Social History Narrative   Divorced.   2 grown daughters. Both out of town.   Lives with her boyfriend, Christophe Louis   Works at [employer], Aeronautical engineer.    Social Determinants of Health   Financial Resource Strain: Not on file  Food Insecurity: Not on file  Transportation Needs: Not on file  Physical Activity: Not on file  Stress: Not on file  Social Connections: Not on file  Intimate Partner Violence: Not on file    Review of Systems Has noticed tinnitus---especially last night and better this morning Hearing is okay--no change No recent travel Did have respiratory infection a few weeks ago--did get prednisone/tessalon some edema    Objective:   Physical Exam Constitutional:      Appearance: Normal appearance.  HENT:     Right Ear: Tympanic membrane and ear canal normal.     Left Ear: Tympanic membrane and ear canal normal.  Eyes:     Extraocular Movements: Extraocular movements intact.     Pupils: Pupils are equal, round, and reactive to light.     Comments: No nystagmus  Cardiovascular:     Rate and Rhythm: Normal rate and regular rhythm.     Heart sounds: No murmur heard.    No gallop.  Pulmonary:     Effort: Pulmonary effort is normal.     Breath sounds: Normal breath sounds. No wheezing or rales.  Musculoskeletal:     Cervical back: Neck supple.  Lymphadenopathy:     Cervical: No cervical adenopathy.  Neurological:     Mental Status: She is alert and oriented to person, place, and time.     Cranial Nerves: Cranial nerves 2-12 are intact.     Motor: Motor function is intact. No weakness or tremor.     Coordination:  Coordination is intact. Coordination normal.     Gait: Gait normal.     Comments: Did have instability with Romberg---put her hand out (but didn't lose balance)            Assessment & Plan:

## 2021-10-23 NOTE — Assessment & Plan Note (Addendum)
Ongoing symptoms of chest discomfort/indigestion that are concerning Famotidine not working Will try omeprazole 20 at bedtime Discussed no eating after supper

## 2021-10-23 NOTE — Assessment & Plan Note (Signed)
Having night symptoms she feels is indigestion Worse with the vertigo Discussed that this could mimic CAD----will proceed with further cardiac evaluation if this persists

## 2021-10-23 NOTE — Assessment & Plan Note (Signed)
Does seem to have vestibular symptoms Nothing to suggest brainstem stroke or tumor Feeling better now Some tinnitus Will just prescribe meclizine 25mg  for tid prn use

## 2021-11-20 ENCOUNTER — Other Ambulatory Visit: Payer: Self-pay | Admitting: Family Medicine

## 2021-12-16 ENCOUNTER — Other Ambulatory Visit: Payer: Self-pay | Admitting: Family Medicine

## 2022-02-20 ENCOUNTER — Ambulatory Visit (LOCAL_COMMUNITY_HEALTH_CENTER): Payer: 59

## 2022-02-20 DIAGNOSIS — Z23 Encounter for immunization: Secondary | ICD-10-CM | POA: Diagnosis not present

## 2022-02-20 DIAGNOSIS — Z719 Counseling, unspecified: Secondary | ICD-10-CM

## 2022-02-20 NOTE — Progress Notes (Signed)
  Are you feeling sick today? No   Have you ever received a dose of COVID-19 Vaccine? [employer], [place], [place], [place], Other) Yes  If yes, which vaccine and how many doses?   PFIZER, 4   Did you bring the vaccination record card or other documentation?  Yes   Do you have a health condition or are undergoing treatment that makes you moderately or severely immunocompromised? This would include, but not be limited to: cancer, HIV, organ transplant, immunosuppressive therapy/high-dose corticosteroids, or moderate/severe primary immunodeficiency.  No  Have you received COVID-19 vaccine before or during hematopoietic cell transplant (HCT) or CAR-T-cell therapies? No  Have you ever had an allergic reaction to: (This would include a severe allergic reaction or a reaction that caused hives, swelling, or respiratory distress, including wheezing.) A component of a COVID-19 vaccine or a previous dose of COVID-19 vaccine? No   Have you ever had an allergic reaction to another vaccine (other thanCOVID-19 vaccine) or an injectable medication? (This would include a severe allergic reaction or a reaction that caused hives, swelling, or respiratory distress, including wheezing.)   No    Do you have a history of any of the following:  Myocarditis or Pericarditis No  Dermal fillers:  No  Multisystem Inflammatory Syndrome (MIS-C or MIS-A)? No  COVID-19 disease within the past 3 months? No  Vaccinated with monkeypox vaccine in the last 4 weeks? No   Administered Fluzone and Booker 12+ vaccine, tolerated well. Given copies of NCIR and VIS, discussed and understood. M.Ezzie Senat, LPN.

## 2022-03-24 ENCOUNTER — Other Ambulatory Visit: Payer: Self-pay | Admitting: Family Medicine

## 2022-05-21 ENCOUNTER — Other Ambulatory Visit: Payer: Self-pay | Admitting: Family Medicine

## 2022-05-21 ENCOUNTER — Telehealth: Payer: Self-pay | Admitting: Family Medicine

## 2022-05-21 NOTE — Telephone Encounter (Signed)
LOV - 10/13/20 NOV - Not scheduled with Dr. Damita Dunnings; will see Dr. Diona Browner 05/24/22 RF - 03/27/21 #90/3

## 2022-05-21 NOTE — Telephone Encounter (Signed)
negativePrescription Request  05/21/2022  Is this a "Controlled Substance" medicine? No  LOV: Visit date not found  What is the name of the medication or equipment?   atorvastatin (LIPITOR) 10 MG tablet  Have you contacted your pharmacy to request a refill? No   Which pharmacy would you like this sent to?   [facility] [place], [place] [address] [place] [address] Phone: [phone] Fax: [phone]  [place], [place] [address] [place] [address] Phone: [phone] Fax: [phone]    Patient notified that their request is being sent to the clinical staff for review and that they should receive a response within 2 business days.   Please advise at Mobile [phone] (mobile)

## 2022-05-22 MED ORDER — ATORVASTATIN CALCIUM 10 MG PO TABS: 10.0000 mg | ORAL_TABLET | Freq: Every day | ORAL | 1 refills | Status: DC

## 2022-05-22 NOTE — Telephone Encounter (Signed)
LVM for patient to c/b and schedule.  

## 2022-05-22 NOTE — Telephone Encounter (Signed)
Prescription sent to [facility].  Needs yearly labs and a visit set up for the spring/summer.  Thanks.

## 2022-05-23 NOTE — Telephone Encounter (Signed)
Pt called returning Kathy Avila's missed call. Told pt Duncan's response, no questions/concerns. Call back #[phone]

## 2022-05-23 NOTE — Telephone Encounter (Signed)
Patient was asked to schedule her appts per Amieya but would not. Patient stated that she will call back and schedule sometime.

## 2022-05-24 ENCOUNTER — Other Ambulatory Visit: Payer: Self-pay

## 2022-05-24 ENCOUNTER — Telehealth: Payer: Self-pay | Admitting: Family Medicine

## 2022-05-24 ENCOUNTER — Encounter: Payer: Self-pay | Admitting: Family Medicine

## 2022-05-24 ENCOUNTER — Ambulatory Visit: Payer: 59 | Admitting: Family Medicine

## 2022-05-24 VITALS — BP 112/72 | HR 79 | Temp 98.2°F | Ht 68.0 in | Wt 195.4 lb

## 2022-05-24 DIAGNOSIS — R0789 Other chest pain: Secondary | ICD-10-CM

## 2022-05-24 DIAGNOSIS — R051 Acute cough: Secondary | ICD-10-CM

## 2022-05-24 DIAGNOSIS — I10 Essential (primary) hypertension: Secondary | ICD-10-CM

## 2022-05-24 DIAGNOSIS — E538 Deficiency of other specified B group vitamins: Secondary | ICD-10-CM

## 2022-05-24 DIAGNOSIS — M8580 Other specified disorders of bone density and structure, unspecified site: Secondary | ICD-10-CM

## 2022-05-24 MED ORDER — PREDNISONE 20 MG PO TABS: ORAL_TABLET | ORAL | 0 refills | Status: DC

## 2022-05-24 MED ORDER — GUAIFENESIN-CODEINE 100-10 MG/5ML PO SYRP: 5.0000 mL | ORAL_SOLUTION | Freq: Every evening | ORAL | 0 refills | Status: DC | PRN

## 2022-05-24 MED ORDER — BENZONATATE 200 MG PO CAPS: 200.0000 mg | ORAL_CAPSULE | Freq: Three times a day (TID) | ORAL | 1 refills | Status: DC | PRN

## 2022-05-24 NOTE — Patient Instructions (Signed)
We will contact you with test results. Complete prednisone taper over 6 days. Can use benzonatate during the day and prescription cough suppressant at night. Rest, fluids. Go to the emergency room if severe shortness of breath or wheezing.

## 2022-05-24 NOTE — Progress Notes (Signed)
Patient ID: Kathy Avila, female    DOB: 25-Dec-1960, 62 y.o.   MRN: [id number]  This visit was conducted in person.  BP 112/72   Pulse 79   Temp 98.2 F (36.8 C) (Oral)   Ht 5\' 8"  (1.727 m)   Wt 195 lb 6 oz (88.6 kg)   SpO2 98%   BMI 29.71 kg/m    CC:  Chief Complaint  Patient presents with   Cough    Deep   Chest Pain   Nasal Congestion    No Covid Test   Fatigue    Subjective:   HPI: Kathy Avila is a 62 y.o. 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 reviewed last OV note from PCP regarding this...  Ongoing since 05/2020 after COVID.... possible reactive airways.  Date of onset:  Has noted worse deeper cough .. 1/9 Initial symptoms included  chest pain with coughing. Head, congestion, nasal,  Body achy. ST, scratchy, no ear pain, mild face pain.   Subjective fever off and on early in illness.  Has SOB, wheeze.  Symptoms progressed  productive cough, small amount of mucus. When lying on side at night chest wall is sore.   Sick contacts:  at work.. double pneumonia and flu COVID testing:   none     She has tried to treat with  OTC  mucinex, nyquil.. hasn't helped.     No history of chronic lung disease such as asthma or COPD. Non-smoker.        Relevant past medical, surgical, family and social history reviewed and updated as indicated. Interim medical history since our last visit reviewed. Allergies and medications reviewed and updated. Outpatient Medications Prior to Visit  Medication Sig Dispense Refill   atorvastatin (LIPITOR) 10 MG tablet Take 1 tablet (10 mg total) by mouth daily. 90 tablet 1   buPROPion (WELLBUTRIN XL) 300 MG 24 hr tablet TAKE 1 TABLET BY MOUTH ONCE A DAY 30 tablet 11   cholecalciferol (VITAMIN D) 1000 units tablet Take 1,000 Units by  mouth daily.     Cyanocobalamin 50 MCG LOZG Take by mouth.     estradiol (ESTRACE) 0.1 MG/GM vaginal cream Place 1 Applicatorful vaginally.     estradiol (VIVELLE-DOT) 0.075 MG/24HR Place 1 patch onto the skin 2 (two) times a week.     meclizine (ANTIVERT) 25 MG tablet Take 1 tablet (25 mg total) by mouth 3 (three) times daily as needed for dizziness or nausea. 60 tablet 1   meloxicam (MOBIC) 7.5 MG tablet Take 7.5 mg by mouth 2 (two) times daily.     Multiple Vitamin (MULTIVITAMIN WITH MINERALS) TABS tablet Take 1 tablet by mouth daily.     omeprazole (PRILOSEC) 20 MG capsule Take 1 capsule (20 mg total) by mouth at bedtime. 90 capsule 3   propranolol ER (INDERAL LA) 60 MG 24 hr capsule TAKE 1 CAPSULE BY MOUTH ONCE DAILY 30 capsule 2   tiZANidine (ZANAFLEX) 2 MG tablet Take 2 mg by mouth 3 (three) times daily.     vitamin E 400 UNIT capsule Take 400 Units by mouth daily.     benzonatate (TESSALON) 200 MG capsule Take 1 capsule (200 mg total) by mouth 3 (three) times daily as needed for cough. Do not bite pill 30 capsule 1  propranolol (INDERAL) 10 MG tablet Take 1 tablet (10 mg total) by mouth 2 (two) times daily as needed (for fast heart rate). (Patient not taking: Reported on 05/24/2022) 60 tablet 1   estradiol (VIVELLE-DOT) 0.1 MG/24HR patch Place 1 patch onto the skin every 3 (three) days.      progesterone (PROMETRIUM) 200 MG capsule Take 200 mg by mouth at bedtime.     No facility-administered medications prior to visit.     Per HPI unless specifically indicated in ROS section below Review of Systems  Constitutional:  Positive for fever. Negative for fatigue.  HENT:  Positive for congestion.   Eyes:  Negative for pain.  Respiratory:  Positive for cough. Negative for shortness of breath.   Cardiovascular:  Positive for chest pain. Negative for palpitations and leg swelling.  Gastrointestinal:  Negative for abdominal pain.  Genitourinary:  Negative for dysuria and vaginal bleeding.   Musculoskeletal:  Negative for back pain.  Neurological:  Negative for syncope, light-headedness and headaches.  Psychiatric/Behavioral:  Negative for dysphoric mood.    Objective:  BP 112/72   Pulse 79   Temp 98.2 F (36.8 C) (Oral)   Ht 5\' 8"  (1.727 m)   Wt 195 lb 6 oz (88.6 kg)   SpO2 98%   BMI 29.71 kg/m   Wt Readings from Last 3 Encounters:  05/24/22 195 lb 6 oz (88.6 kg)  10/23/21 197 lb (89.4 kg)  09/28/21 198 lb (89.8 kg)      Physical Exam Constitutional:      General: She is not in acute distress.    Appearance: She is well-developed. She is not ill-appearing or toxic-appearing.  HENT:     Head: Normocephalic.     Right Ear: Hearing, tympanic membrane, ear canal and external ear normal. Tympanic membrane is not erythematous, retracted or bulging.     Left Ear: Hearing, tympanic membrane, ear canal and external ear normal. Tympanic membrane is not erythematous, retracted or bulging.     Nose: Mucosal edema and rhinorrhea present.     Right Sinus: No maxillary sinus tenderness or frontal sinus tenderness.     Left Sinus: No maxillary sinus tenderness or frontal sinus tenderness.     Mouth/Throat:     Pharynx: Uvula midline.  Eyes:     General: Lids are normal. Lids are everted, no foreign bodies appreciated.     Conjunctiva/sclera: Conjunctivae normal.     Pupils: Pupils are equal, round, and reactive to light.  Neck:     Thyroid: No thyroid mass or thyromegaly.     Vascular: No carotid bruit.     Trachea: Trachea normal.  Cardiovascular:     Rate and Rhythm: Normal rate and regular rhythm.     Pulses: Normal pulses.     Heart sounds: Normal heart sounds, S1 normal and S2 normal. No murmur heard.    No friction rub. No gallop.  Pulmonary:     Effort: Pulmonary effort is normal. No tachypnea or respiratory distress.     Breath sounds: Normal breath sounds. No decreased breath sounds, wheezing, rhonchi or rales.  Musculoskeletal:     Cervical back: Normal  range of motion and neck supple.  Skin:    General: Skin is warm and dry.     Findings: No rash.  Neurological:     Mental Status: She is alert.  Psychiatric:        Mood and Affect: Mood is not anxious or depressed.  Speech: Speech normal.        Behavior: Behavior normal. Behavior is cooperative.        Judgment: Judgment normal.       Results for orders placed or performed in visit on 10/13/20  CBC with Differential/Platelet  Result Value Ref Range   WBC 9.6 3.4 - 10.8 x10E3/uL   RBC 5.79 (H) 3.77 - 5.28 x10E6/uL   Hemoglobin 14.5 11.1 - 15.9 g/dL   Hematocrit 45.5 34.0 - 46.6 %   MCV 79 79 - 97 fL   MCH 25.0 (L) 26.6 - 33.0 pg   MCHC 31.9 31.5 - 35.7 g/dL   RDW 14.2 11.7 - 15.4 %   Platelets 268 150 - 450 x10E3/uL   Neutrophils 57 Not Estab. %   Lymphs 33 Not Estab. %   Monocytes 7 Not Estab. %   Eos 2 Not Estab. %   Basos 1 Not Estab. %   Neutrophils Absolute 5.6 1.4 - 7.0 x10E3/uL   Lymphocytes Absolute 3.1 0.7 - 3.1 x10E3/uL   Monocytes Absolute 0.7 0.1 - 0.9 x10E3/uL   EOS (ABSOLUTE) 0.2 0.0 - 0.4 x10E3/uL   Basophils Absolute 0.1 0.0 - 0.2 x10E3/uL   Immature Granulocytes 0 Not Estab. %   Immature Grans (Abs) 0.0 0.0 - 0.1 x10E3/uL  Comprehensive metabolic panel  Result Value Ref Range   Glucose 94 65 - 99 mg/dL   BUN 13 8 - 27 mg/dL   Creatinine, Ser 0.96 0.57 - 1.00 mg/dL   eGFR 67 >59 mL/min/1.73   BUN/Creatinine Ratio 14 12 - 28   Sodium 139 134 - 144 mmol/L   Potassium 4.3 3.5 - 5.2 mmol/L   Chloride 103 96 - 106 mmol/L   CO2 24 20 - 29 mmol/L   Calcium 9.6 8.7 - 10.3 mg/dL   Total Protein 6.9 6.0 - 8.5 g/dL   Albumin 4.4 3.8 - 4.8 g/dL   Globulin, Total 2.5 1.5 - 4.5 g/dL   Albumin/Globulin Ratio 1.8 1.2 - 2.2   Bilirubin Total 0.2 0.0 - 1.2 mg/dL   Alkaline Phosphatase 100 44 - 121 IU/L   AST 15 0 - 40 IU/L   ALT 13 0 - 32 IU/L  TSH  Result Value Ref Range   TSH 2.050 0.450 - 4.500 uIU/mL    Assessment and Plan  Acute  cough Assessment & Plan: Acute worsening of chronic issue Significant exposure to flu. Most likely ongoing viral upper respiratory tract infection.  No clear sign of bacterial superinfection at this point.  Will do send out RSV, flu, COVID testing. Given sinus pressure shortness of breath and wheeze noted per patient (even though lung exam clear) Will treat with prednisone taper. Treat cough with benzonatate during the day and guaifenesin/codeine syrup at bedtime.  Return and ER precautions provided.   Chest wall pain Assessment & Plan: Acute, secondary to frequent coughing fits.  Can use Tylenol as needed for pain.   Other orders -     predniSONE; 3 tabs by mouth daily x 3 days, then 2 tabs by mouth daily x 2 days then 1 tab by mouth daily x 2 days  Dispense: 15 tablet; Refill: 0 -     Benzonatate; Take 1 capsule (200 mg total) by mouth 3 (three) times daily as needed for cough. Do not bite pill  Dispense: 30 capsule; Refill: 1 -     guaiFENesin-Codeine; Take 5-10 mLs by mouth at bedtime as needed for cough.  Dispense: 180 mL; Refill:  0    Return in about 4 weeks (around 06/21/2022) for  CPX or follow up with PCP to discuss multiple issues.   Eliezer Lofts, MD

## 2022-05-24 NOTE — Addendum Note (Signed)
Addended by: Carter Kitten on: 05/24/2022 09:29 AM   Modules accepted: Orders

## 2022-05-24 NOTE — Assessment & Plan Note (Signed)
Acute worsening of chronic issue Significant exposure to flu. Most likely ongoing viral upper respiratory tract infection.  No clear sign of bacterial superinfection at this point.  Will do send out RSV, flu, COVID testing. Given sinus pressure shortness of breath and wheeze noted per patient (even though lung exam clear) Will treat with prednisone taper. Treat cough with benzonatate during the day and guaifenesin/codeine syrup at bedtime.  Return and ER precautions provided.

## 2022-05-24 NOTE — Assessment & Plan Note (Signed)
Acute, secondary to frequent coughing fits.  Can use Tylenol as needed for pain.

## 2022-05-24 NOTE — Telephone Encounter (Signed)
Patient is scheduled for cpe on February 9,2024. She would like to have labs done at [employer] where she works at. She would like the orders mailed to her.

## 2022-05-25 LAB — COVID-19, FLU A+B AND RSV
Influenza A, NAA: NOT DETECTED
Influenza B, NAA: NOT DETECTED
RSV, NAA: NOT DETECTED
SARS-CoV-2, NAA: NOT DETECTED

## 2022-05-26 NOTE — Telephone Encounter (Signed)
Please send her a letter for CMET, CBC with diff, lipid panel-I10 Vit D- M85.80 B12-E53.8  Thanks.

## 2022-05-27 ENCOUNTER — Encounter: Payer: Self-pay | Admitting: Family Medicine

## 2022-05-27 ENCOUNTER — Other Ambulatory Visit: Payer: Self-pay

## 2022-05-27 ENCOUNTER — Telehealth: Payer: Self-pay

## 2022-05-27 NOTE — Addendum Note (Signed)
Addended by: Sherrilee Gilles B on: 05/27/2022 09:40 AM   Modules accepted: Orders

## 2022-05-27 NOTE — Telephone Encounter (Signed)
Pt called checking on status of note for work. Pt wanted [facility] know that she doesn't believe the meds are working, pt states she still feels bad. Call back # [phone]

## 2022-05-27 NOTE — Telephone Encounter (Signed)
Spoke with Kathy Avila and advised work note was sent to her MyChart earlier today.  Patient would like to stop by office to pick up printed copy.  Letter left up front for patient to pick up.  While on the phone Kathy Avila states she feels like her symptoms are getting worse.  She states she is still having a deep cough and body aches.  She doesn't feel like the medication that was prescribed on Friday is helping.  Please advise.

## 2022-05-27 NOTE — Telephone Encounter (Signed)
Pt called checking the status of her note to return to work? Pt is requesting for the note to say she can return on or after Wednesday, 05/29/22. Call back # [phone]

## 2022-05-27 NOTE — Telephone Encounter (Signed)
Labs order and sent over to [facility].

## 2022-05-27 NOTE — Telephone Encounter (Signed)
Called pt to follow up on access nurse note.  Pt already been made aware of results.  Gave pt her MyChart username so that she could access MyChart again.  Pt verbalized thanks for the help.  She did report that she is feeling a little better.  Encouraged her to call with any needs.    [place] Night - Client TELEPHONE ADVICE RECORD AccessNurse Patient Name: Kathy Avila OVER BEY Gender: Female DOB: April 16, 1961 Age: 62 Y 11 M 7 D Return Phone Number: [phone] (Primary) Address: City/ State/ Zip: [place] [address] Client [facility] Night - Client Client Site [place] Provider Eliezer Lofts - MD Contact Type Call Who Is Calling Patient / Member / Family / Caregiver Call Type Triage / Clinical Relationship To Patient Self Return Phone Number [phone] (Primary) Chief Complaint CHEST PAIN - pain, pressure, heaviness or tightness Reason for Call Request for Lab/Test Results Initial Comment Caller states she got a flu,rsv and covid test and was sent to [facility]. She also states she cant pull up her MyChart but wants to know her test results. She has a cough,congestion,fatigue,back pain and chest pain as well. Translation No Nurse Assessment Nurse: Hardin Negus, RN, Mardene Celeste Date/Time Eilene Ghazi Time): 05/25/2022 11:58:16 AM Confirm and document reason for call. If symptomatic, describe symptoms. ---She got a flu,rsv and covid test and was sent to [facility]. She has been sick since [place] and her s+s are about the same. She has ,congestion,fatigue,back pain, chills and chest pain. She is taking the meds as ordered. She is drinking plenty of fluids. Does the patient have any new or worsening symptoms? ---Yes Will a triage be completed? ---Yes Related visit to physician within the last 2 weeks? ---Yes Does the PT have any chronic conditions? (i.e. diabetes, asthma, this includes High risk factors  for pregnancy, etc.) ---No Is this a behavioral health or substance abuse call? ---No Guidelines Guideline Title Affirmed Question Affirmed Notes Nurse Date/Time ([place] Time) COVID-19 - Diagnosed or Suspected [1] COVID-19 diagnosed by doctor (or NP/PA) AND [2] mild symptoms (e.g., cough, fever, others) AND [3] no Oren Bracket 05/25/2022 12:01:24 PM PLEASE NOTE: All timestamps contained within this report are represented as [place] Standard Time. CONFIDENTIALTY NOTICE: This fax transmission is intended only for the addressee. It contains information that is legally privileged, confidential or otherwise protected from use or disclosure. If you are not the intended recipient, you are strictly prohibited from reviewing, disclosing, copying using or disseminating any of this information or taking any action in reliance on or regarding this information. If you have received this fax in error, please notify us immediately by telephone so that we can arrange for its return to [place]. Phone: [phone], Toll-Free: [phone], Fax: [phone] Page: 2 of 2 Call Id: [id number] Guidelines Guideline Title Affirmed Question Affirmed Notes Nurse Date/Time Eilene Ghazi Time) complications or SOB Disp. Time Eilene Ghazi Time) Disposition Final User 05/25/2022 11:56:28 AM Send to Urgent Gomez Cleverly 05/25/2022 12:06:01 PM Home Care Yes Hardin Negus RN, Mardene Celeste Final Disposition 05/25/2022 12:06:01 PM Home Care Yes Hardin Negus, RN, Lenox Ponds Disagree/Comply Comply Caller Understands Yes PreDisposition InappropriateToAsk Care Advice Given Per Guideline HOME CARE: * You should be able to treat this at home. CALL BACK IF: * You become worse CARE ADVICE given per COVID-19 - DIAGNOSED OR SUSPECTED (Adult) guideline. REASSURANCE AND EDUCATION - DIAGNOSED WITH COVID-19 BY DOCTOR (OR NP/PA) AND MILD SYMPTOMS: * Your doctor has diagnosed you  as having COVID-19 based on your symptoms and  COVID-19 testing.

## 2022-05-27 NOTE — Addendum Note (Signed)
Addended by: Barkley Bruns on: 05/27/2022 09:28 AM   Modules accepted: Orders

## 2022-05-27 NOTE — Telephone Encounter (Signed)
Patient wanted lab orders also mailed to her; orders have been mailed and patient notified.

## 2022-05-28 ENCOUNTER — Other Ambulatory Visit: Payer: Self-pay | Admitting: Family Medicine

## 2022-05-28 MED ORDER — AZITHROMYCIN 250 MG PO TABS: ORAL_TABLET | ORAL | 0 refills | Status: DC

## 2022-06-14 LAB — CBC WITH DIFFERENTIAL/PLATELET
Basophils Absolute: 0 10*3/uL (ref 0.0–0.2)
Basos: 1 %
EOS (ABSOLUTE): 0.2 10*3/uL (ref 0.0–0.4)
Eos: 3 %
Hematocrit: 44.4 % (ref 34.0–46.6)
Hemoglobin: 14.1 g/dL (ref 11.1–15.9)
Immature Grans (Abs): 0 10*3/uL (ref 0.0–0.1)
Immature Granulocytes: 0 %
Lymphocytes Absolute: 2.1 10*3/uL (ref 0.7–3.1)
Lymphs: 33 %
MCH: 24.7 pg — ABNORMAL LOW (ref 26.6–33.0)
MCHC: 31.8 g/dL (ref 31.5–35.7)
MCV: 78 fL — ABNORMAL LOW (ref 79–97)
Monocytes Absolute: 0.5 10*3/uL (ref 0.1–0.9)
Monocytes: 7 %
Neutrophils Absolute: 3.6 10*3/uL (ref 1.4–7.0)
Neutrophils: 56 %
Platelets: 268 10*3/uL (ref 150–450)
RBC: 5.72 x10E6/uL — ABNORMAL HIGH (ref 3.77–5.28)
RDW: 15.9 % — ABNORMAL HIGH (ref 11.7–15.4)
WBC: 6.4 10*3/uL (ref 3.4–10.8)

## 2022-06-14 LAB — COMPREHENSIVE METABOLIC PANEL
ALT: 14 IU/L (ref 0–32)
AST: 23 IU/L (ref 0–40)
Albumin/Globulin Ratio: 2 (ref 1.2–2.2)
Albumin: 4.4 g/dL (ref 3.9–4.9)
Alkaline Phosphatase: 83 IU/L (ref 44–121)
BUN/Creatinine Ratio: 19 (ref 12–28)
BUN: 19 mg/dL (ref 8–27)
Bilirubin Total: 0.5 mg/dL (ref 0.0–1.2)
CO2: 20 mmol/L (ref 20–29)
Calcium: 9.3 mg/dL (ref 8.7–10.3)
Chloride: 103 mmol/L (ref 96–106)
Creatinine, Ser: 1.01 mg/dL — ABNORMAL HIGH (ref 0.57–1.00)
Globulin, Total: 2.2 g/dL (ref 1.5–4.5)
Glucose: 97 mg/dL (ref 70–99)
Potassium: 4.3 mmol/L (ref 3.5–5.2)
Sodium: 140 mmol/L (ref 134–144)
Total Protein: 6.6 g/dL (ref 6.0–8.5)
eGFR: 63 mL/min/{1.73_m2} (ref >59)

## 2022-06-14 LAB — LIPID PANEL
Chol/HDL Ratio: 2.1 ratio (ref 0.0–4.4)
Cholesterol, Total: 136 mg/dL (ref 100–199)
HDL: 66 mg/dL (ref >39)
LDL Chol Calc (NIH): 52 mg/dL (ref 0–99)
Triglycerides: 99 mg/dL (ref 0–149)
VLDL Cholesterol Cal: 18 mg/dL (ref 5–40)

## 2022-06-14 LAB — VITAMIN B12: Vitamin B-12: 212 pg/mL — ABNORMAL LOW (ref 232–1245)

## 2022-06-14 LAB — VITAMIN D 25 HYDROXY (VIT D DEFICIENCY, FRACTURES): Vit D, 25-Hydroxy: 45 ng/mL (ref 30.0–100.0)

## 2022-06-19 ENCOUNTER — Other Ambulatory Visit: Payer: Self-pay | Admitting: Family Medicine

## 2022-06-21 ENCOUNTER — Other Ambulatory Visit: Payer: Self-pay | Admitting: Family Medicine

## 2022-06-21 ENCOUNTER — Ambulatory Visit (INDEPENDENT_AMBULATORY_CARE_PROVIDER_SITE_OTHER): Payer: 59 | Admitting: Family Medicine

## 2022-06-21 ENCOUNTER — Encounter: Payer: Self-pay | Admitting: Family Medicine

## 2022-06-21 VITALS — BP 120/72 | HR 98 | Temp 97.9°F | Ht 68.0 in | Wt 193.0 lb

## 2022-06-21 DIAGNOSIS — R000 Tachycardia, unspecified: Secondary | ICD-10-CM

## 2022-06-21 DIAGNOSIS — Z23 Encounter for immunization: Secondary | ICD-10-CM

## 2022-06-21 DIAGNOSIS — Z7189 Other specified counseling: Secondary | ICD-10-CM

## 2022-06-21 DIAGNOSIS — E785 Hyperlipidemia, unspecified: Secondary | ICD-10-CM

## 2022-06-21 DIAGNOSIS — F419 Anxiety disorder, unspecified: Secondary | ICD-10-CM

## 2022-06-21 DIAGNOSIS — M545 Low back pain, unspecified: Secondary | ICD-10-CM

## 2022-06-21 DIAGNOSIS — E538 Deficiency of other specified B group vitamins: Secondary | ICD-10-CM

## 2022-06-21 DIAGNOSIS — Z0000 Encounter for general adult medical examination without abnormal findings: Secondary | ICD-10-CM

## 2022-06-21 DIAGNOSIS — Z79890 Hormone replacement therapy (postmenopausal): Secondary | ICD-10-CM

## 2022-06-21 DIAGNOSIS — Z1211 Encounter for screening for malignant neoplasm of colon: Secondary | ICD-10-CM

## 2022-06-21 MED ORDER — PROPRANOLOL HCL 10 MG PO TABS: 10.0000 mg | ORAL_TABLET | Freq: Two times a day (BID) | ORAL | 5 refills | Status: DC | PRN

## 2022-06-21 MED ORDER — TIZANIDINE HCL 2 MG PO TABS: 2.0000 mg | ORAL_TABLET | Freq: Three times a day (TID) | ORAL | 5 refills | Status: DC

## 2022-06-21 MED ORDER — MELOXICAM 7.5 MG PO TABS: 7.5000 mg | ORAL_TABLET | Freq: Two times a day (BID) | ORAL | 5 refills | Status: DC

## 2022-06-21 NOTE — Progress Notes (Signed)
CPE- See plan.  Routine anticipatory guidance given to patient.  See health maintenance.  The possibility exists that previously documented standard health maintenance information may have been brought forward from a previous encounter into this note.  If needed, that same information has been updated to reflect the current situation based on today's encounter.    Tetanus 2024 Flu 2023 Covid prev done.  PNA due at 63 shingles d/w pt.  Living will discussed with patient. Kathy Avila designated if patient were incapacitated.  Diet and exercise d/w pt.  DXA d/w pt, to be considered at 51 Mammogram. 2023 per gyn.   Colon cancer screening d/w pt.  Refer to GI.   Pap 2023 per gyn.    B12 def. Had been taking B12 intermittently.  B12 low.    Vit D wnl.  D/w pt.    D/w pt about using meloxicam and tizanidine for her back pain, L sciatica.    Elevated Cholesterol: Using medications without problems: yes Muscle aches: no Diet compliance: d/w pt.  Exercise: d/w pt.    She had been drowsy with 80m propranolol.  She was using that PRN for tachycardia, a few times a month.  D/w pt about using 163mprn.    Mood d/w pt.  Still on wellbutrin.  On HRT per gyn.  She had hysterectomy.  B12 low. No SI/HI.  Anxiety d/w pt.  D/w pt about options.    HRT d/w pt.  On estradiol patch per outside clinic.  HA improved on patch.  D/w pt about gyn clinic f/u.  See AVS.    PMH and SH reviewed  Meds, vitals, and allergies reviewed.   ROS: Per HPI.  Unless specifically indicated otherwise in HPI, the patient denies:  General: fever. Eyes: acute vision changes ENT: sore throat Cardiovascular: chest pain Respiratory: SOB GI: vomiting GU: dysuria Musculoskeletal: acute back pain Derm: acute rash Neuro: acute motor dysfunction Psych: worsening mood Endocrine: polydipsia Heme: bleeding Allergy: hayfever  GEN: nad, alert and oriented HEENT: ncat NECK: supple w/o LA CV: rrr. PULM: ctab, no inc  wob ABD: soft, +bs EXT: no edema SKIN: no acute rash

## 2022-06-21 NOTE — Patient Instructions (Addendum)
Please check with gynecology about options to taper the estrogen patch.   Try propranolol 72m if needed for elevated heart rate or for anxiety.  Let [place] if you don't get a call about seeing GI.  Take care.  Glad to see you. Restart B12 and recheck in about 3 months.

## 2022-06-23 DIAGNOSIS — Z0000 Encounter for general adult medical examination without abnormal findings: Secondary | ICD-10-CM | POA: Insufficient documentation

## 2022-06-23 DIAGNOSIS — E785 Hyperlipidemia, unspecified: Secondary | ICD-10-CM | POA: Insufficient documentation

## 2022-06-23 NOTE — Assessment & Plan Note (Signed)
Tetanus 2024 Flu 2023 Covid prev done.  PNA due at 63 shingles d/w pt.  Living will discussed with patient. Kathy Avila designated if patient were incapacitated.  Diet and exercise d/w pt.  DXA d/w pt, to be considered at 30 Mammogram. 2023 per gyn.   Colon cancer screening d/w pt.  Refer to GI.   Pap 2023 per gyn.

## 2022-06-23 NOTE — Assessment & Plan Note (Signed)
D/w pt about using meloxicam and tizanidine for her back pain, L sciatica.  Routine cautions given to patient.

## 2022-06-23 NOTE — Assessment & Plan Note (Signed)
She had been drowsy with 12m propranolol.  She was using that PRN for tachycardia, a few times a month.  D/w pt about using 171mprn.

## 2022-06-23 NOTE — Assessment & Plan Note (Signed)
Living will discussed with patient. Kathy Avila designated if patient were incapacitated.

## 2022-06-23 NOTE — Assessment & Plan Note (Signed)
HRT d/w pt.  On estradiol patch per outside clinic.  HA improved on patch.  D/w pt about gyn clinic f/u.  See AVS.

## 2022-06-23 NOTE — Assessment & Plan Note (Signed)
Restart B12 and recheck in about 3 months.

## 2022-06-23 NOTE — Assessment & Plan Note (Signed)
Continue atorvastatin.  Continue work on diet and exercise. 

## 2022-06-23 NOTE — Assessment & Plan Note (Signed)
Mood d/w pt.  Still on wellbutrin.  On HRT per gyn.  She had hysterectomy.  B12 low. No SI/HI.  Anxiety d/w pt.  D/w pt about options.  She did not tolerate venlafaxine prior.  We talked about potential sexual side effects with SSRI use.  She could continue Wellbutrin, replete her B12, and then use propranolol as needed for anxiety, 10 mg at a time.  She can update me as needed.

## 2022-09-02 ENCOUNTER — Telehealth: Payer: Self-pay | Admitting: Family Medicine

## 2022-09-02 MED ORDER — OMEPRAZOLE 20 MG PO CPDR: 20.0000 mg | DELAYED_RELEASE_CAPSULE | Freq: Every day | ORAL | 3 refills | Status: DC

## 2022-09-02 NOTE — Telephone Encounter (Signed)
Prescription Request  09/02/2022  LOV: 06/21/2022  What is the name of the medication or equipment? omeprazole (PRILOSEC) 20 MG capsule   Have you contacted your pharmacy to request a refill? No   Which pharmacy would you like this sent to?    [facility] DR[facility]ORE #[id number] [place], [place] - [address] AT [facility] OF [address] [place] [address] Phone: [phone] Fax: [phone]    Patient notified that their request is being sent to the clinical staff for review and that they should receive a response within 2 business days.   Please advise at Mobile [phone] (mobile)

## 2022-09-02 NOTE — Telephone Encounter (Signed)
Erx sent

## 2022-09-06 ENCOUNTER — Ambulatory Visit: Payer: 59 | Admitting: Family Medicine

## 2022-09-13 ENCOUNTER — Ambulatory Visit: Payer: 59 | Admitting: Family Medicine

## 2022-09-13 ENCOUNTER — Encounter: Payer: Self-pay | Admitting: Family Medicine

## 2022-09-13 VITALS — BP 118/80 | HR 72 | Temp 97.7°F | Ht 68.0 in | Wt 192.0 lb

## 2022-09-13 DIAGNOSIS — R519 Headache, unspecified: Secondary | ICD-10-CM

## 2022-09-13 DIAGNOSIS — K219 Gastro-esophageal reflux disease without esophagitis: Secondary | ICD-10-CM | POA: Diagnosis not present

## 2022-09-13 DIAGNOSIS — F419 Anxiety disorder, unspecified: Secondary | ICD-10-CM | POA: Diagnosis not present

## 2022-09-13 MED ORDER — PROPRANOLOL HCL 20 MG PO TABS: 20.0000 mg | ORAL_TABLET | Freq: Every morning | ORAL | 3 refills | Status: DC

## 2022-09-13 MED ORDER — OMEPRAZOLE 40 MG PO CPDR: 40.0000 mg | DELAYED_RELEASE_CAPSULE | Freq: Every day | ORAL | 3 refills | Status: DC

## 2022-09-13 NOTE — Progress Notes (Unsigned)
HA.  Ongoing everyday x 1-2 months. Patient has been taking tylenol but does not really help.  B temporal, forehead pain and frontal scalp.  No posterior head pain.  Constant.  No FCNAVD.  No ear pain.  No ST.  Taking 20mg  propranolol at baseline.  No photophobia or phonophobia.  No aura.  No vision loss.  No paresthesia.  Normal swallowing.  Nausea from HA.  H/o similar HA in the past, around menses.  Was prev told she had migraines in the past.  Caffeine helps some, ie worse w/o caffeine.  Usually 2 cups of coffee a day.  Felt slightly better in a dark room.    GERD. Had been taking 40mg  omeprazole at baseline.  20mg  didn't help.    She had been taking 20mg  propranolol each AM and that helped with anxiety.  New rx sent.  D/w pt.    Meds, vitals, and allergies reviewed.   ROS: Per HPI unless specifically indicated in ROS section   GEN: nad, alert and oriented HEENT: mucous membranes moist NECK: supple w/o LA CV: rrr.  PULM: ctab, no inc wob ABD: soft, +bs EXT: no edema SKIN: no acute rash Temporal area not ttp.   Frontal area ttp B CN 2-12 wnl B, S/S wnl x4

## 2022-09-13 NOTE — Patient Instructions (Addendum)
Take 1/2 tab tizanidine today.  Then take 1/2 to 1 tab tonight.   Repeat tomorrow if needed.  Sedation caution.   Stop tylenol.   Take meloxicam daily with food.   Let me know if this isn't helping.

## 2022-09-15 DIAGNOSIS — R519 Headache, unspecified: Secondary | ICD-10-CM | POA: Insufficient documentation

## 2022-09-15 NOTE — Assessment & Plan Note (Signed)
  GERD. Had been taking 40mg  omeprazole at baseline.  20mg  didn't help.  Continue 40 mg.

## 2022-09-15 NOTE — Assessment & Plan Note (Signed)
She was previously told she had diagnosis of migraines.  Discussed migraine versus medication overuse versus tension type headache.  Discussed options. Take 1/2 tab tizanidine today.  Then take 1/2 to 1 tab tonight.   Repeat tomorrow if needed.  Sedation caution.  Stop tylenol.  Take meloxicam daily with food.  Let me know if this isn't helping.   She agrees to plan. 30 minutes were devoted to patient care in this encounter (this includes time spent reviewing the patient's file/history, interviewing and examining the patient, counseling/reviewing plan with patient).

## 2022-09-15 NOTE — Assessment & Plan Note (Signed)
She had been taking 20mg  propranolol each AM and that helped with anxiety.  New rx sent.  D/w pt.

## 2022-11-06 ENCOUNTER — Telehealth: Payer: Self-pay | Admitting: Family Medicine

## 2022-11-06 NOTE — Telephone Encounter (Signed)
Prescription Request  11/06/2022  LOV: 09/13/2022  What is the name of the medication or equipment? meloxicam (MOBIC) 7.5 MG tablet   Have you contacted your pharmacy to request a refill? No   Which pharmacy would you like this sent to?   [facility] DRUG STORE #[id number] [place] [address], [place] - [address] AT [facility] OF [address] [place] [address] Phone: [phone] Fax: [phone]    Patient notified that their request is being sent to the clinical staff for review and that they should receive a response within 2 business days.   Please advise at Mobile [phone] (mobile)  Patient says this medication has been helping her. Patient also asked if there is any way Dr. Para March could prescribe her a higher does to where she could take one per day instead of two per day. She believes this will help the medication last longer.

## 2022-11-07 NOTE — Telephone Encounter (Signed)
Left message on VM per DPR. Advised pt that she should have refills left from rx done 06-21-22 #60/5.

## 2022-11-25 ENCOUNTER — Other Ambulatory Visit: Payer: Self-pay | Admitting: Family Medicine

## 2023-01-17 ENCOUNTER — Telehealth: Payer: Self-pay | Admitting: Family Medicine

## 2023-01-17 ENCOUNTER — Other Ambulatory Visit: Payer: Self-pay | Admitting: Family Medicine

## 2023-01-17 MED ORDER — BUPROPION HCL ER (XL) 300 MG PO TB24: 300.0000 mg | ORAL_TABLET | Freq: Every day | ORAL | 1 refills | Status: DC

## 2023-01-17 NOTE — Telephone Encounter (Signed)
Prescription Request  01/17/2023  LOV: 09/13/2022  What is the name of the medication or equipment?  buPROPion (WELLBUTRIN XL) 300 MG 24 hr tablet  Have you contacted your pharmacy to request a refill? Yes   Which pharmacy would you like this sent to?    [facility] DRUG STORE #[id number] [address], [place] - [address] AT [facility] OF [address] [place] [address] Phone: [phone] Fax: [phone]    Patient notified that their request is being sent to the clinical staff for review and that they should receive a response within 2 business days.   Please advise at [facility] [phone]

## 2023-03-25 IMAGING — DX DG CHEST 2V
2 series · 2 of 2 positions shown · non-contrast
Comparison: Chest x-ray 01/01/2018.

CLINICAL DATA: Chronic cough with wheezing.

EXAM:
CHEST - 2 VIEW

[chest pa]
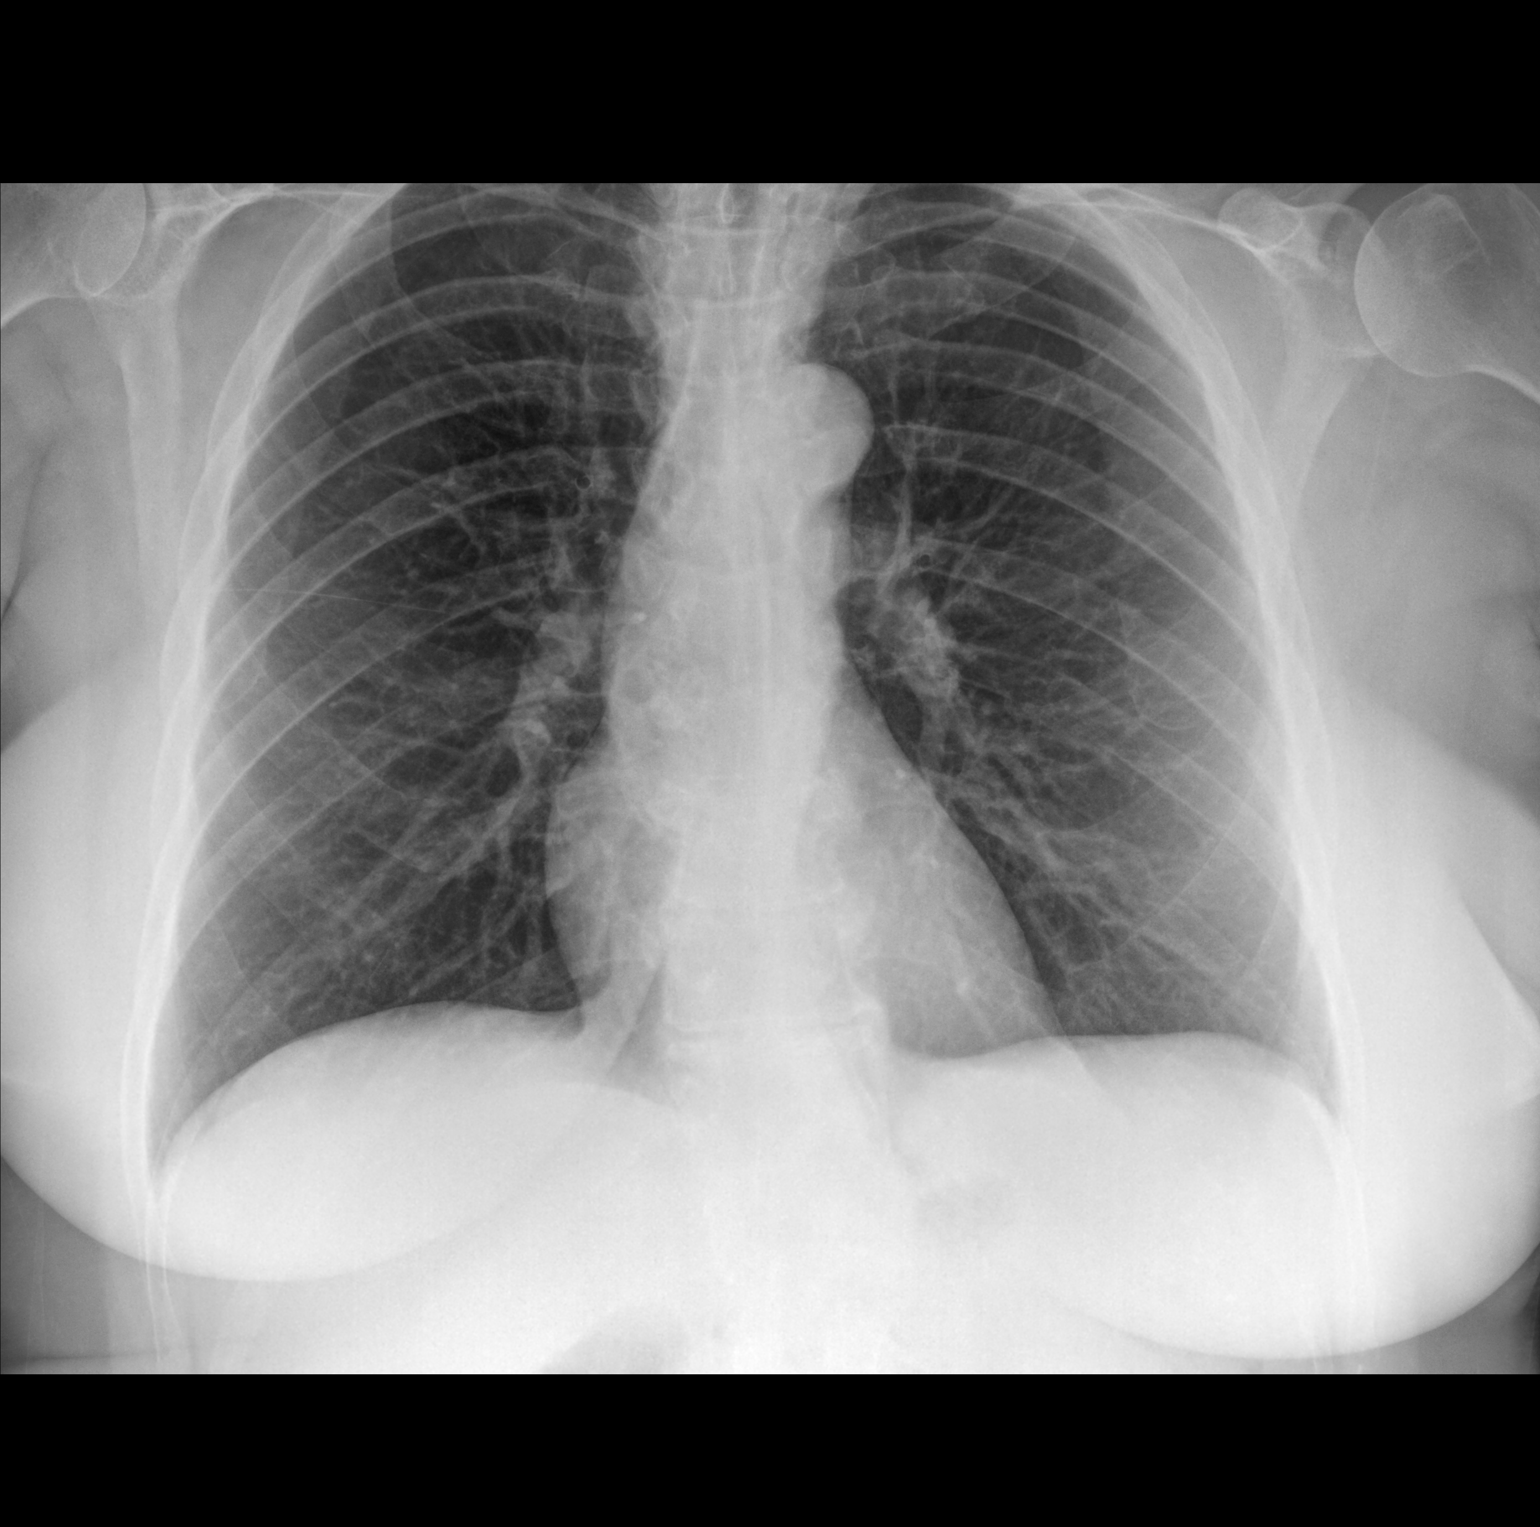

[chest lat]
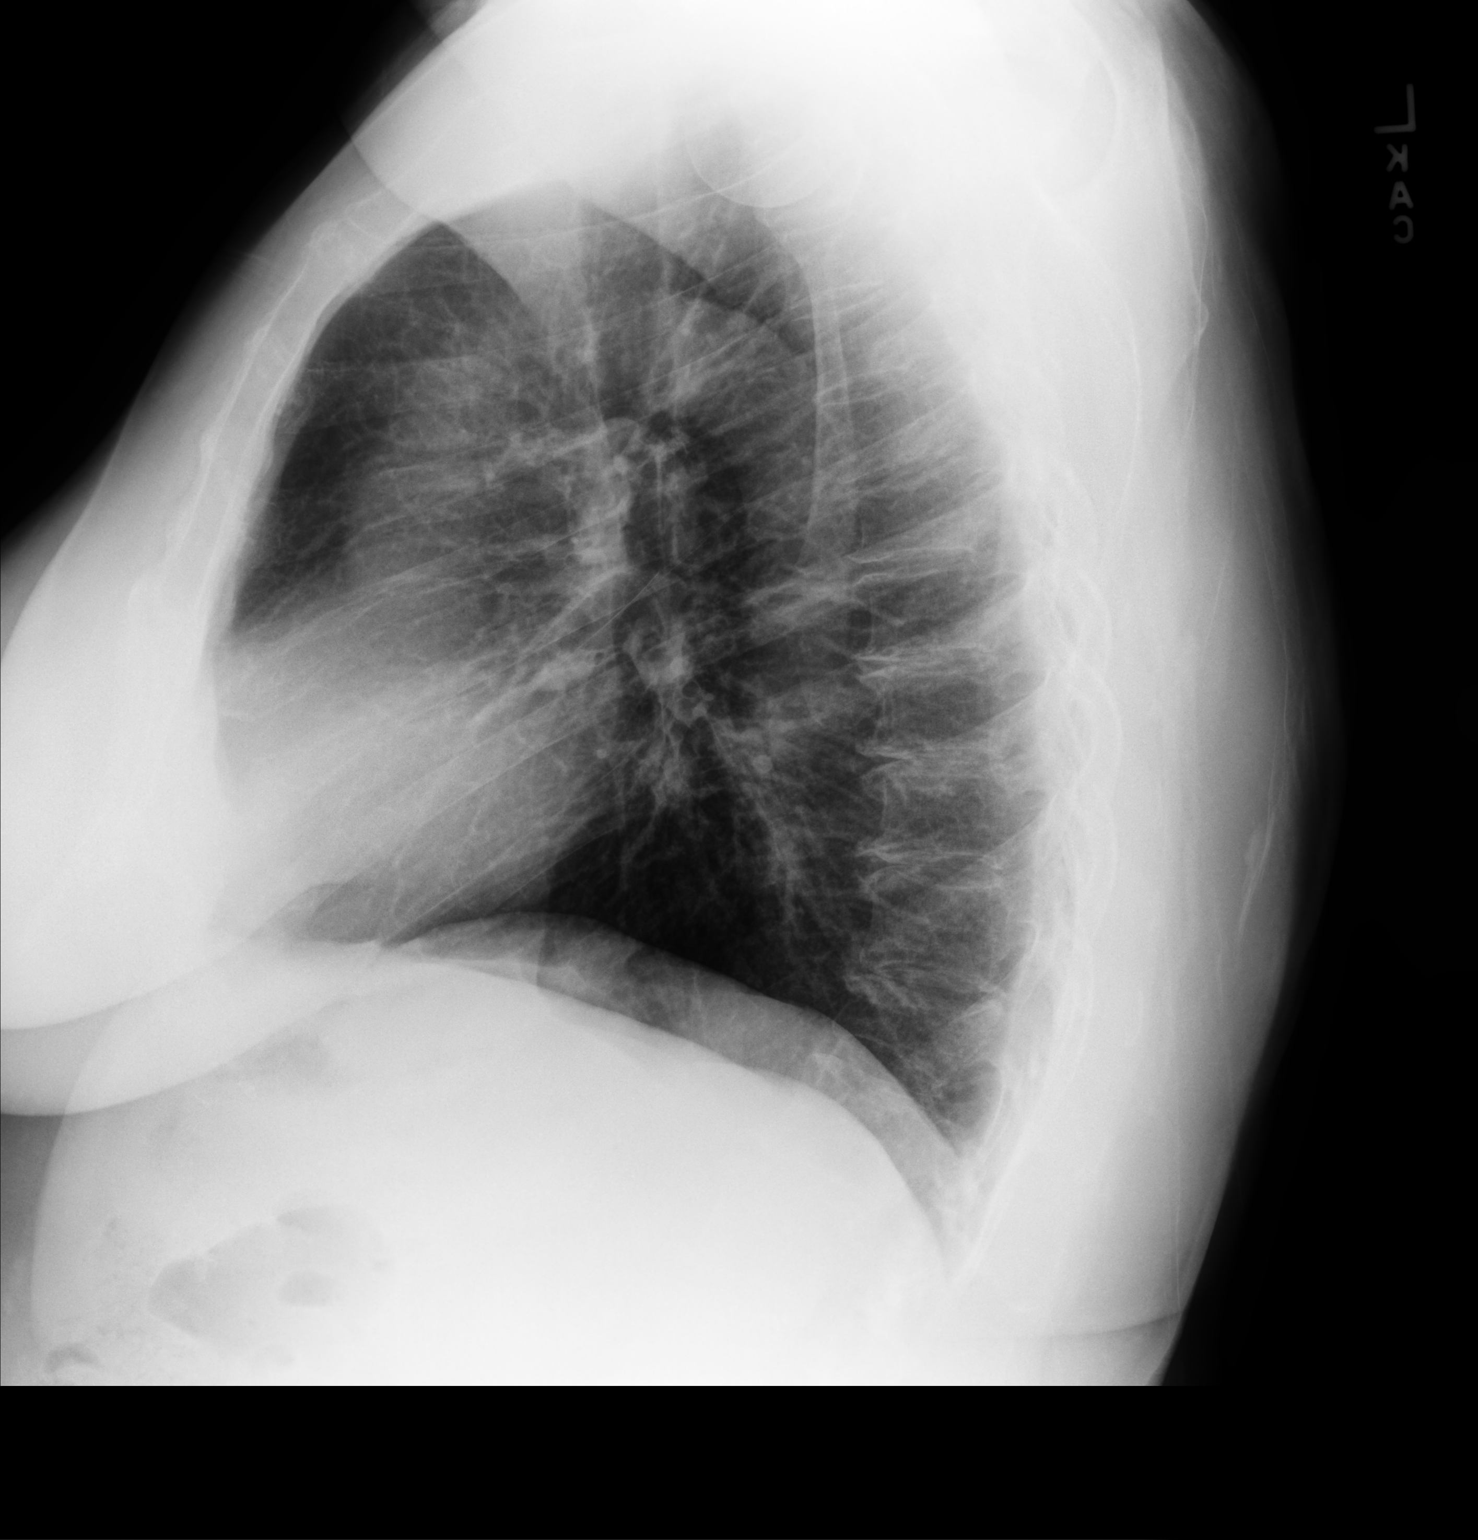

[2 of 2 positions shown; findings below may reference images not displayed]

FINDINGS: The heart size and mediastinal contours are within normal limits.
Both lungs are clear. The visualized skeletal structures are
unremarkable. Cervical spinal fusion plate is present.
IMPRESSION: No active cardiopulmonary disease.

## 2023-04-21 ENCOUNTER — Telehealth: Payer: Self-pay | Admitting: Family Medicine

## 2023-04-21 MED ORDER — MECLIZINE HCL 25 MG PO TABS: 25.0000 mg | ORAL_TABLET | Freq: Three times a day (TID) | ORAL | 1 refills | Status: AC | PRN

## 2023-04-21 NOTE — Telephone Encounter (Signed)
Prescription Request  04/21/2023  LOV: 09/13/2022  What is the name of the medication or equipment? meclizine (ANTIVERT) 25 MG tablet, completely out  Have you contacted your pharmacy to request a refill? No   Which pharmacy would you like this sent to?  [facility] DR[facility] STORE #[id number] [place] Rough, [place] - [address] AT [facility] OF [address] [place] [address] Phone: [phone] Fax: [phone]   Patient notified that their request is being sent to the clinical staff for review and that they should receive a response within 2 business days.   Please advise at Mobile [phone] (mobile)

## 2023-05-20 ENCOUNTER — Other Ambulatory Visit: Payer: Self-pay | Admitting: Family Medicine

## 2023-07-11 ENCOUNTER — Other Ambulatory Visit: Payer: Self-pay | Admitting: Family Medicine

## 2023-07-11 NOTE — Telephone Encounter (Signed)
 Refill sent for 90 days. Pt is due for CPE. Please help her get scheduled. Thank you

## 2023-07-11 NOTE — Telephone Encounter (Signed)
 Spoke to pt, pt states she'll back to schedule

## 2023-07-18 ENCOUNTER — Encounter: Admitting: Family Medicine

## 2023-07-25 ENCOUNTER — Encounter: Admitting: Family Medicine

## 2023-08-08 ENCOUNTER — Encounter: Admitting: Family Medicine

## 2023-08-14 ENCOUNTER — Encounter: Payer: Self-pay | Admitting: Family Medicine

## 2023-08-14 ENCOUNTER — Ambulatory Visit: Admitting: Family Medicine

## 2023-08-14 VITALS — BP 112/60 | HR 68 | Temp 98.4°F | Ht 65.55 in | Wt 170.6 lb

## 2023-08-14 DIAGNOSIS — E785 Hyperlipidemia, unspecified: Secondary | ICD-10-CM | POA: Diagnosis not present

## 2023-08-14 DIAGNOSIS — M8580 Other specified disorders of bone density and structure, unspecified site: Secondary | ICD-10-CM

## 2023-08-14 DIAGNOSIS — I499 Cardiac arrhythmia, unspecified: Secondary | ICD-10-CM

## 2023-08-14 DIAGNOSIS — E538 Deficiency of other specified B group vitamins: Secondary | ICD-10-CM | POA: Diagnosis not present

## 2023-08-14 DIAGNOSIS — R0789 Other chest pain: Secondary | ICD-10-CM

## 2023-08-14 MED ORDER — OMEPRAZOLE 40 MG PO CPDR: 40.0000 mg | DELAYED_RELEASE_CAPSULE | Freq: Every day | ORAL | 3 refills | Status: AC

## 2023-08-14 MED ORDER — ATORVASTATIN CALCIUM 10 MG PO TABS: 10.0000 mg | ORAL_TABLET | Freq: Every day | ORAL | 3 refills | Status: AC

## 2023-08-14 MED ORDER — MELOXICAM 7.5 MG PO TABS: 7.5000 mg | ORAL_TABLET | Freq: Two times a day (BID) | ORAL | 5 refills | Status: AC

## 2023-08-14 MED ORDER — BUPROPION HCL ER (XL) 300 MG PO TB24: 300.0000 mg | ORAL_TABLET | Freq: Every day | ORAL | 3 refills | Status: AC

## 2023-08-14 MED ORDER — PROPRANOLOL HCL 20 MG PO TABS: 20.0000 mg | ORAL_TABLET | Freq: Every morning | ORAL | 3 refills | Status: DC

## 2023-08-14 MED ORDER — METHOCARBAMOL 500 MG PO TABS: 500.0000 mg | ORAL_TABLET | Freq: Three times a day (TID) | ORAL | 1 refills | Status: DC | PRN

## 2023-08-14 NOTE — Patient Instructions (Signed)
 We'll update you about your labs.  You should get a call about seeing cardiology.  Drink enough fluid to keep your urine clear or light colored.  Take meloxicam and robaxin for the pain.  Take care.  Glad to see you.

## 2023-08-14 NOTE — Progress Notes (Signed)
 Yearly visit deferred given other issues.  Irregular pulse.  EKG done at [facility]. Bigeminy noted.  No syncope.  She could have chest pain that would radiate to her back over the last few months. Could also have pain when getting up from a chair- she attributed that to her back. She was seen by [facility] about her back, at the [facility] clinic.  She fell before her chest sx started.  She didn't have this CP prior to the fall.    She can occ feel lightheaded on standing.  Already on statin.  No aches from statin, ie no pain prior to the fall.    Normal coronary arteries with 0% stenosis in 2017.    H/o B12 def, recheck pending.   H/o osteopenia, recheck vit D pending.   Meds, vitals, and allergies reviewed.   ROS: Per HPI unless specifically indicated in ROS section   GEN: nad, alert and oriented HEENT: ncat NECK: supple w/o LA CV: RRR with ectopy noted . PULM: ctab, no inc wob ABD: soft, +bs EXT: no edema SKIN: Well-perfused. Anterior chest wall ttp  Discussed with patient about getting record release from [facility] in [place].  EKG with bigeminy discussed with patient at office visit.  No acute ST changes.

## 2023-08-15 LAB — CBC WITH DIFFERENTIAL/PLATELET
Basophils Absolute: 0 10*3/uL (ref 0.0–0.2)
Basos: 0 %
EOS (ABSOLUTE): 0.2 10*3/uL (ref 0.0–0.4)
Eos: 2 %
Hematocrit: 43 % (ref 34.0–46.6)
Hemoglobin: 14.3 g/dL (ref 11.1–15.9)
Immature Grans (Abs): 0 10*3/uL (ref 0.0–0.1)
Immature Granulocytes: 0 %
Lymphocytes Absolute: 3 10*3/uL (ref 0.7–3.1)
Lymphs: 48 %
MCH: 26.5 pg — ABNORMAL LOW (ref 26.6–33.0)
MCHC: 33.3 g/dL (ref 31.5–35.7)
MCV: 80 fL (ref 79–97)
Monocytes Absolute: 0.7 10*3/uL (ref 0.1–0.9)
Monocytes: 12 %
Neutrophils Absolute: 2.4 10*3/uL (ref 1.4–7.0)
Neutrophils: 38 %
Platelets: 209 10*3/uL (ref 150–450)
RBC: 5.4 x10E6/uL — ABNORMAL HIGH (ref 3.77–5.28)
RDW: 13.1 % (ref 11.7–15.4)
WBC: 6.4 10*3/uL (ref 3.4–10.8)

## 2023-08-15 LAB — COMPREHENSIVE METABOLIC PANEL WITH GFR
ALT: 9 IU/L (ref 0–32)
AST: 20 IU/L (ref 0–40)
Albumin: 4.2 g/dL (ref 3.9–4.9)
Alkaline Phosphatase: 78 IU/L (ref 44–121)
BUN/Creatinine Ratio: 13 (ref 12–28)
BUN: 12 mg/dL (ref 8–27)
Bilirubin Total: 0.4 mg/dL (ref 0.0–1.2)
CO2: 24 mmol/L (ref 20–29)
Calcium: 9.4 mg/dL (ref 8.7–10.3)
Chloride: 102 mmol/L (ref 96–106)
Creatinine, Ser: 0.92 mg/dL (ref 0.57–1.00)
Globulin, Total: 2.1 g/dL (ref 1.5–4.5)
Glucose: 90 mg/dL (ref 70–99)
Potassium: 4.1 mmol/L (ref 3.5–5.2)
Sodium: 138 mmol/L (ref 134–144)
Total Protein: 6.3 g/dL (ref 6.0–8.5)
eGFR: 69 mL/min/{1.73_m2} (ref >59)

## 2023-08-15 LAB — LIPID PANEL
Chol/HDL Ratio: 2.1 ratio (ref 0.0–4.4)
Cholesterol, Total: 140 mg/dL (ref 100–199)
HDL: 66 mg/dL (ref >39)
LDL Chol Calc (NIH): 58 mg/dL (ref 0–99)
Triglycerides: 87 mg/dL (ref 0–149)
VLDL Cholesterol Cal: 16 mg/dL (ref 5–40)

## 2023-08-15 LAB — VITAMIN B12: Vitamin B-12: >2000 pg/mL — ABNORMAL HIGH (ref 232–1245)

## 2023-08-15 LAB — TSH: TSH: 1.98 u[IU]/mL (ref 0.450–4.500)

## 2023-08-15 LAB — VITAMIN D 25 HYDROXY (VIT D DEFICIENCY, FRACTURES): Vit D, 25-Hydroxy: 38.2 ng/mL (ref 30.0–100.0)

## 2023-08-17 ENCOUNTER — Encounter: Payer: Self-pay | Admitting: Family Medicine

## 2023-08-17 ENCOUNTER — Other Ambulatory Visit: Payer: Self-pay | Admitting: Family Medicine

## 2023-08-17 DIAGNOSIS — I499 Cardiac arrhythmia, unspecified: Secondary | ICD-10-CM | POA: Insufficient documentation

## 2023-08-17 NOTE — Assessment & Plan Note (Signed)
 H/o osteopenia, recheck vit D pending.

## 2023-08-17 NOTE — Assessment & Plan Note (Signed)
 H/o B12 def, recheck pending.

## 2023-08-17 NOTE — Assessment & Plan Note (Signed)
 Chest wall is tender to palpation.  Requesting orthopedic records.  This appears to be separate from her bigeminy.  At this point still okay for outpatient follow-up.

## 2023-08-17 NOTE — Assessment & Plan Note (Signed)
 Bigeminy discussed with patient at office visit.  No acute ST changes.  Bigeminy of unclear duration.  This may not be related to her other symptoms, which appear to be musculoskeletal.  See notes on labs.  Refer to cardiology.  Routine cautions given to patient.

## 2023-08-17 NOTE — Assessment & Plan Note (Signed)
 Would continue atorvastatin for now.  See notes on labs.

## 2023-08-19 ENCOUNTER — Telehealth: Payer: Self-pay | Admitting: Family Medicine

## 2023-08-19 DIAGNOSIS — M549 Dorsalgia, unspecified: Secondary | ICD-10-CM

## 2023-08-19 NOTE — Telephone Encounter (Signed)
 Copied from CRM [id number]. Topic: Referral - Question >> Aug 19, 2023 11:56 AM Elizebeth Brooking wrote: Reason for CRM: Patient called to be schedule for for back doctor, stating she called them and they stated she would need to be new patient since she havent been there in while. Patient wanted to know if Dr.Duncan can send referral over to  Tia Alert, MD [facility] [address] [place], [address] [place], [place] [address] [phone]

## 2023-08-20 NOTE — Telephone Encounter (Signed)
 Patient notified. She has been scheduled to be seen 08/20/21

## 2023-08-20 NOTE — Telephone Encounter (Signed)
I put in the referral.  Thanks.  

## 2023-08-20 NOTE — Addendum Note (Signed)
 Addended by: Joaquim Nam on: 08/20/2023 01:21 PM   Modules accepted: Orders

## 2023-10-20 ENCOUNTER — Ambulatory Visit: Admitting: Cardiology

## 2023-11-21 ENCOUNTER — Telehealth: Payer: Self-pay | Admitting: Family Medicine

## 2023-11-21 ENCOUNTER — Encounter: Payer: Self-pay | Admitting: Cardiology

## 2023-11-21 ENCOUNTER — Ambulatory Visit: Attending: Cardiology | Admitting: Cardiology

## 2023-11-21 ENCOUNTER — Other Ambulatory Visit (HOSPITAL_COMMUNITY): Payer: Self-pay

## 2023-11-21 ENCOUNTER — Other Ambulatory Visit: Payer: Self-pay | Admitting: *Deleted

## 2023-11-21 VITALS — BP 141/82 | HR 84 | Resp 16 | Ht 65.0 in | Wt 165.0 lb

## 2023-11-21 DIAGNOSIS — M940 Chondrocostal junction syndrome [Tietze]: Secondary | ICD-10-CM | POA: Diagnosis not present

## 2023-11-21 DIAGNOSIS — I10 Essential (primary) hypertension: Secondary | ICD-10-CM

## 2023-11-21 DIAGNOSIS — I491 Atrial premature depolarization: Secondary | ICD-10-CM

## 2023-11-21 MED ORDER — TRAMADOL HCL 50 MG PO TABS
50.0000 mg | ORAL_TABLET | Freq: Four times a day (QID) | ORAL | 0 refills | Status: DC | PRN
  Filled 2023-11-21: qty 60, 15d supply, fill #0

## 2023-11-21 MED ORDER — LOSARTAN POTASSIUM 25 MG PO TABS
25.0000 mg | ORAL_TABLET | Freq: Every evening | ORAL | 2 refills | Status: DC
  Filled 2023-11-21: qty 30, 30d supply, fill #0

## 2023-11-21 NOTE — Progress Notes (Signed)
 Cardiology Office Note:  .   Date:  11/22/2023  ID:  Kathy Avila, DOB 10-05-1960, MRN [id number] PCP: Cleatus Arlyss RAMAN, MD  [facility] Providers Cardiologist:  Gordy Bergamo, MD   History of Present Illness: .   Kathy Avila is a 63 y.o. Caucasian female patient who I had seen in 2022 after she presented with rapid palpitations 2 to 3 months after COVID-19 infection.  In 2017 she has had cardiac catheterization revealing normal coronary arteries and treadmill stress test at that time it revealed severely reduced exercise capacity and only was able to exercise for 3 minutes and achieving 4.6 METS without ischemia.  EKG in 11/15/2020 had revealed frequent PACs and atrial couplets and triplets and felt her symptoms were more indicative of atrial tachycardia.  Repeat echocardiogram on 01/05/2021 revealed essentially normal LVEF at 50 to 55% with mild LVH and mild MR.  She is now self referred back for evaluation of chest pain.  Discussed the use of AI scribe software for clinical note transcription with the patient, who gave verbal consent to proceed.  History of Present Illness Kathy Avila is a 63 year old female with atrial tachycardia who presents with chest pain. She experiences chest pain once or twice a week, often associated with stress and exacerbated when lying on her side. She takes tramadol  50 mg as needed for her symptoms.  She has atrial tachycardia and takes propranolol  20 mg once daily for heart palpitations. No current heart palpitations or heart racing symptoms are present. A heart catheterization in 2017 showed normal coronary arteries.  Her blood pressure was slightly elevated during a recent health screening at work. She does not regularly monitor her blood pressure at home.   Labs   Lab Results  Component Value Date   CHOL 140 08/14/2023   HDL 66 08/14/2023   LDLCALC 58 08/14/2023   TRIG 87 08/14/2023   CHOLHDL 2.1 08/14/2023   No results found  for: LIPOA  Lab Results  Component Value Date   NA 138 08/14/2023   K 4.1 08/14/2023   CO2 24 08/14/2023   GLUCOSE 90 08/14/2023   BUN 12 08/14/2023   CREATININE 0.92 08/14/2023   CALCIUM  9.4 08/14/2023   EGFR 69 08/14/2023   GFRNONAA >60 12/29/2019      Latest Ref Rng & Units 08/14/2023    4:30 PM 06/13/2022    7:52 AM 10/13/2020    4:26 PM  BMP  Glucose 70 - 99 mg/dL 90  97  94   BUN 8 - 27 mg/dL 12  19  13    Creatinine 0.57 - 1.00 mg/dL 9.07  8.98  9.03   BUN/Creat Ratio 12 - 28 13  19  14    Sodium 134 - 144 mmol/L 138  140  139   Potassium 3.5 - 5.2 mmol/L 4.1  4.3  4.3   Chloride 96 - 106 mmol/L 102  103  103   CO2 20 - 29 mmol/L 24  20  24    Calcium  8.7 - 10.3 mg/dL 9.4  9.3  9.6       Latest Ref Rng & Units 08/14/2023    4:30 PM 06/13/2022    7:52 AM 10/13/2020    4:26 PM  CBC  WBC 3.4 - 10.8 x10E3/uL 6.4  6.4  9.6   Hemoglobin 11.1 - 15.9 g/dL 85.6  85.8  85.4   Hematocrit 34.0 - 46.6 % 43.0  44.4  45.5  Platelets 150 - 450 x10E3/uL 209  268  268    Lab Results  Component Value Date   HGBA1C 5.4 01/06/2020    Lab Results  Component Value Date   TSH 1.980 08/14/2023     ROS  Review of Systems  Cardiovascular:  Positive for chest pain and palpitations (improved and occasional brief skipped beats). Negative for dyspnea on exertion and leg swelling.   Physical Exam:   VS:  BP (!) 141/82 (BP Location: Left Arm, Patient Position: Sitting, Cuff Size: Normal)   Pulse 84   Resp 16   Ht 5' 5 (1.651 m)   Wt 165 lb (74.8 kg)   SpO2 99%   BMI 27.46 kg/m    Wt Readings from Last 3 Encounters:  11/21/23 165 lb (74.8 kg)  08/14/23 170 lb 9.6 oz (77.4 kg)  09/13/22 192 lb (87.1 kg)    Physical Exam Neck:     Vascular: No carotid bruit or JVD.  Cardiovascular:     Rate and Rhythm: Normal rate and regular rhythm. Frequent Extrasystoles are present.    Pulses: Intact distal pulses.     Heart sounds: Normal heart sounds. No murmur heard.    No gallop.  Pulmonary:      Effort: Pulmonary effort is normal.     Breath sounds: Normal breath sounds.  Chest:     Chest wall: Tenderness (bilateral costochondral junction) present.  Abdominal:     General: Bowel sounds are normal.     Palpations: Abdomen is soft.  Musculoskeletal:     Right lower leg: No edema.     Left lower leg: No edema.    Studies Reviewed: SABRA    CARDIAC CATHETERIZATION 09/06/2015 The patient has had canadian class 4 angina with high risk stress test and risk factors which include high cholesterol. Left ventricular function is normal with ejection fraction of 60%. Normal coronary arteries with 0% stenosis  Echocardiogram 01/05/2021: Normal LV systolic function with visual EF 50-55%. Left ventricle cavity is normal in size. Mild left ventricular hypertrophy. Normal global wall motion. Doppler evidence of grade I (impaired) diastolic dysfunction, normal LAP. Mild (Grade I) mitral regurgitation. Mild tricuspid regurgitation. No evidence of pulmonary hypertension. No prior study for comparison  EKG:    EKG Interpretation Date/Time:  Friday November 21 2023 08:54:26 EDT Ventricular Rate:  67 PR Interval:  176 QRS Duration:  84 QT Interval:  408 QTC Calculation: 431 R Axis:   50  Text Interpretation: EKG 11/21/2023: Normal sinus rhythm at rate of 67 bpm with frequent PACs in bigeminal pattern.  Poor R wave progression, probably normal variant. Compared to 12/29/2019, frequent PACs new. Confirmed by Kathlee Barnhardt, Jagadeesh ([id number]) on 11/21/2023 9:19:37 AM    Medications ordered    Meds ordered this encounter  Medications   traMADol  (ULTRAM ) 50 MG tablet    Sig: Take 1 tablet (50 mg total) by mouth every 6 (six) hours as needed for moderate pain (pain score 4-6).    Dispense:  60 tablet    Refill:  0   losartan  (COZAAR ) 25 MG tablet    Sig: Take 1 tablet (25 mg total) by mouth every evening.    Dispense:  30 tablet    Refill:  2     ASSESSMENT AND PLAN: .      ICD-10-CM   1.  Costochondritis  M94.0 traMADol  (ULTRAM ) 50 MG tablet    2. PAC (premature atrial contraction)  I49.1 EKG 12-Lead    Basic Metabolic  Panel (BMET)    3. Primary hypertension  I10 losartan  (COZAAR ) 25 MG tablet    Basic Metabolic Panel (BMET)     Assessment & Plan Costochondritis Severe costochondritis with intermittent chest pain, likely due to inflammation of the cartilage connecting the ribs to the breastbone. Pain exacerbated by certain movements and stress. No cardiac etiology identified. EKG shows extra heartbeat but no significant cardiac issues. Previous heart catheterization in 2017 showed no blockages. - Apply heat and ice packs to the affected area as needed. - Use Voltaren  gel topically 2-3 times daily for two weeks. - Prescribe tramadol  for pain management as needed.  Frequent premature atrial contractions Known history of frequent premature atrial contractions. Currently asymptomatic with no significant issues. - Continue propranolol  20 mg once daily.  Atrial tachycardia Atrial tachycardia managed with propranolol , with no recent episodes reported. - Continue propranolol  20 mg once daily.  Hypertension Blood pressure recorded at 141/80, indicating possible development of hypertension. Propranolol  is currently being taken, but additional medication is needed to achieve target blood pressure of 130/80 or less. - Prescribe losartan  25 mg once daily. - Monitor blood pressure periodically, aiming for 130/80 or less. - Follow up with primary care physician, Dr. Ludie Solian, to monitor blood pressure.   Signed,  Gordy Bergamo, MD, [facility] 11/22/2023, 6:18 AM [facility] [address] [place], [place] [address] Phone: [phone]. Fax:  [phone]

## 2023-11-21 NOTE — Telephone Encounter (Signed)
 Copied from CRM [id number]. Topic: Clinical - Medication Refill >> Nov 21, 2023 10:33 AM Martinique E wrote: Medication: propranolol  (INDERAL ) 20 MG tablet  Has the patient contacted their pharmacy? No (Agent: If no, request that the patient contact the pharmacy for the refill. If patient does not wish to contact the pharmacy document the reason why and proceed with request.) (Agent: If yes, when and what did the pharmacy advise?)  This is the patient's preferred pharmacy:  [id number] [place], [place] - [address] AT [facility] OF [address] [place] [address] Phone: [phone] Fax: [phone]  Is this the correct pharmacy for this prescription? Yes If no, delete pharmacy and type the correct one.   Has the prescription been filled recently? No  Is the patient out of the medication? No, 2 pills left.  Has the patient been seen for an appointment in the last year OR does the patient have an upcoming appointment? Yes  Can we respond through MyChart? Yes  Agent: Please be advised that Rx refills may take up to 3 business days. We ask that you follow-up with your pharmacy.

## 2023-11-21 NOTE — Telephone Encounter (Signed)
 Pharmacy Rep reported that was recently 90-day filled in May, pt should have 1 month left on current fill. Rep did confirm refills available.

## 2023-11-21 NOTE — Patient Instructions (Signed)
 Medication Instructions:  Your physician has recommended you make the following change in your medication: Start Losartan  25 mg by mouth daily    *If you need a refill on your cardiac medications before your next appointment, please call your pharmacy*  Lab Work: Have lab work done in 2-3 weeks.  BMP.  This is not fasting.  Can be done at any [facility] location.  There is a [facility] on the first floor of our building If you have labs (blood work) drawn today and your tests are completely normal, you will receive your results only by: MyChart Message (if you have MyChart) OR A paper copy in the mail If you have any lab test that is abnormal or we need to change your treatment, we will call you to review the results.  Testing/Procedures: none  Follow-Up: At [facility], you and your health needs are our priority.  As part of our continuing mission to provide you with exceptional heart care, our providers are all part of one team.  This team includes your primary Cardiologist (physician) and Advanced Practice Providers or APPs (Physician Assistants and Nurse Practitioners) who all work together to provide you with the care you need, when you need it.  Your next appointment:   As needed  Provider:   Gordy Bergamo, MD    We recommend signing up for the patient portal called MyChart.  Sign up information is provided on this After Visit Summary.  MyChart is used to connect with patients for Virtual Visits (Telemedicine).  Patients are able to view lab/test results, encounter notes, upcoming appointments, etc.  Non-urgent messages can be sent to your provider as well.   To learn more about what you can do with MyChart, go to [email or web address].   Other Instructions

## 2023-11-27 ENCOUNTER — Other Ambulatory Visit: Payer: Self-pay | Admitting: Student

## 2023-11-27 ENCOUNTER — Encounter: Payer: Self-pay | Admitting: Student

## 2023-11-27 DIAGNOSIS — S32030D Wedge compression fracture of third lumbar vertebra, subsequent encounter for fracture with routine healing: Secondary | ICD-10-CM

## 2023-11-28 ENCOUNTER — Telehealth: Payer: Self-pay | Admitting: Family Medicine

## 2023-11-28 ENCOUNTER — Encounter: Payer: Self-pay | Admitting: Family Medicine

## 2023-11-28 ENCOUNTER — Ambulatory Visit: Admitting: Family Medicine

## 2023-11-28 VITALS — BP 118/72 | HR 72 | Temp 98.6°F | Ht 65.0 in | Wt 162.8 lb

## 2023-11-28 DIAGNOSIS — Z659 Problem related to unspecified psychosocial circumstances: Secondary | ICD-10-CM

## 2023-11-28 DIAGNOSIS — R0789 Other chest pain: Secondary | ICD-10-CM | POA: Diagnosis not present

## 2023-11-28 DIAGNOSIS — G8929 Other chronic pain: Secondary | ICD-10-CM

## 2023-11-28 DIAGNOSIS — M940 Chondrocostal junction syndrome [Tietze]: Secondary | ICD-10-CM | POA: Diagnosis not present

## 2023-11-28 DIAGNOSIS — I498 Other specified cardiac arrhythmias: Secondary | ICD-10-CM

## 2023-11-28 DIAGNOSIS — M545 Low back pain, unspecified: Secondary | ICD-10-CM

## 2023-11-28 MED ORDER — TRAMADOL HCL 50 MG PO TABS: 25.0000 mg | ORAL_TABLET | Freq: Four times a day (QID) | ORAL | Status: AC | PRN

## 2023-11-28 NOTE — Telephone Encounter (Signed)
 Dr. Ladona,   This patient has persistent bigeminy without alarming cardiovascular symptoms in spite of taking propranolol .  Her blood pressure was improved at our clinic with addition of losartan .  Is it reasonable to increase propranolol  to try to suppress bigeminy? I need your input.  Thanks.

## 2023-11-28 NOTE — Progress Notes (Signed)
 Bigeminy on exam with pulse double what was recorded with pulse ox.  Manual check 72.  D/w pt about bigeminy pathophysiology.  Had been on propranolol  with losartan  recently added.    She was seen by cardiology with eval for costochondritis.  D/w pt about breaking tramadol  in half to limit sedation risk.  She has reproducible positional chest discomfort.  Not worse, but not better.   Anatomy d/w pt.    H/o compression fracture, with eval by Dr. Joshua. She has imaging pending.  She is dealing with lower back pain from that.    Other social stressors d/w pt, discord with husband re: lack of intimacy.  No SI/HI.    Meds, vitals, and allergies reviewed.   ROS: Per HPI unless specifically indicated in ROS section   Nad Ncat Neck supple, no LA Rrr Ctab Abd soft, not ttp Bigeminy on exam.   35 minutes were devoted to patient care in this encounter (this includes time spent reviewing the patient's file/history, interviewing and examining the patient, counseling/reviewing plan with patient).

## 2023-11-28 NOTE — Assessment & Plan Note (Signed)
 With follow-up imaging pending per outside clinic.

## 2023-11-28 NOTE — Patient Instructions (Signed)
 Ill update Dr. Ladona.  Check your med list at home.  Take care.  Glad to see you.[ Try taking a 1/2 tab of tramadol  for pain.  See if that helps.  Sedation caution.

## 2023-11-28 NOTE — Assessment & Plan Note (Signed)
 Can take half a tablet of tramadol  at a time to see if that helps with pain without causing sedation.

## 2023-11-28 NOTE — Assessment & Plan Note (Signed)
 Not lightheaded.  With persistent bigeminy in spite of propranolol  use.  I did not increase her propranolol  yet.  I need input from cardiology about this.  See phone phone note.

## 2023-11-28 NOTE — Assessment & Plan Note (Signed)
 Discussed safety, refer to counseling.  Routine cautions given to patient.

## 2023-11-29 NOTE — Telephone Encounter (Signed)
 Asymptomatic atrial bigeminy, would not bend over backwards to control the rhythm in an asymptomatic individuals however presently on propranolol  20 mg daily, could consider increasing it to 20 mg twice daily which is probably a more appropriate dose in view of short acting nature of the propranolol .

## 2023-11-30 MED ORDER — PROPRANOLOL HCL 20 MG PO TABS: 20.0000 mg | ORAL_TABLET | Freq: Two times a day (BID) | ORAL | Status: AC

## 2023-11-30 NOTE — Telephone Encounter (Signed)
 Noted, thanks.  We'll update patient.    =================== AV, please update patient, would try taking propranolol  BID to see if that will suppress bigeminy and help control her pulse rate.

## 2023-12-01 NOTE — Telephone Encounter (Signed)
 Copied from CRM [id number]. Topic: General - Other >> Dec 01, 2023  1:27 PM Robinson H wrote: Reason for CRM: Patient returning call to office, please call patient back at work number can't answer cell phone at work.  [place] [phone] until 5pm

## 2023-12-01 NOTE — Telephone Encounter (Signed)
 Left voicemail for patient to return call to office.

## 2023-12-02 ENCOUNTER — Telehealth: Payer: Self-pay

## 2023-12-02 NOTE — Telephone Encounter (Signed)
 Spoke with patient after she called. Closing encounter   Copied from CRM [id number]. Topic: General - Other >> Dec 02, 2023  8:43 AM Chasity T wrote: Reason for CRM: Patient is calling to return call for nurse Marlina Cataldi.Please contact her back on her work number [phone]

## 2023-12-02 NOTE — Telephone Encounter (Signed)
 Closing encounter issue addressed in another encounter    Copied from CRM [id number]. Topic: General - Other >> Dec 01, 2023  5:06 PM Lavanda D wrote: Reason for CRM: Patient returning call for Harsimran, she will call back in the am or Shanikia can reach her @ her work number.

## 2023-12-02 NOTE — Telephone Encounter (Signed)
 Returned call to patient and advised of Dr. Cleatus instructions. Patient understood

## 2023-12-05 ENCOUNTER — Other Ambulatory Visit

## 2023-12-05 ENCOUNTER — Ambulatory Visit
Admission: RE | Admit: 2023-12-05 | Discharge: 2023-12-05 | Disposition: A | Discharge status: Home / Self Care | Source: Ambulatory Visit | Attending: Student | Admitting: Student

## 2023-12-05 ENCOUNTER — Ambulatory Visit: Payer: Self-pay | Admitting: Cardiology

## 2023-12-05 DIAGNOSIS — S32030D Wedge compression fracture of third lumbar vertebra, subsequent encounter for fracture with routine healing: Secondary | ICD-10-CM

## 2023-12-05 DIAGNOSIS — I10 Essential (primary) hypertension: Secondary | ICD-10-CM

## 2023-12-05 LAB — BASIC METABOLIC PANEL WITH GFR
BUN/Creatinine Ratio: 10 — ABNORMAL LOW (ref 12–28)
BUN: 11 mg/dL (ref 8–27)
CO2: 23 mmol/L (ref 20–29)
Calcium: 9.5 mg/dL (ref 8.7–10.3)
Chloride: 103 mmol/L (ref 96–106)
Creatinine, Ser: 1.07 mg/dL — ABNORMAL HIGH (ref 0.57–1.00)
Glucose: 81 mg/dL (ref 70–99)
Potassium: 4.5 mmol/L (ref 3.5–5.2)
Sodium: 140 mmol/L (ref 134–144)
eGFR: 58 mL/min/1.73 — ABNORMAL LOW (ref >59)

## 2023-12-05 NOTE — Progress Notes (Signed)
 Stable kidney function and potassium levels on Losartan 

## 2023-12-15 MED ORDER — LOSARTAN POTASSIUM 25 MG PO TABS: 25.0000 mg | ORAL_TABLET | Freq: Every evening | ORAL | 3 refills | Status: DC

## 2023-12-23 ENCOUNTER — Telehealth: Payer: Self-pay

## 2023-12-23 NOTE — Telephone Encounter (Signed)
 Copied from CRM #[id number]. Topic: Clinical - Medication Question >> Dec 23, 2023  8:38 AM Emylou G wrote: Reason for CRM: Patient called.. has taken losartan  (COZAAR ) 25 MG tablet and propranolol  (INDERAL ) 20 MG tablet - she is unsure if either or both causing her to swell.. can she get water pill to help? Said to call if you need to.SABRA

## 2023-12-24 ENCOUNTER — Telehealth: Payer: Self-pay

## 2023-12-24 NOTE — Telephone Encounter (Signed)
 Neither typically cause swelling.    How much swelling?  Does it changes from nighttime to the next AM?  Any extra salt in diet?    Any shortness of breath?  Please let me know about that.  Thanks.

## 2023-12-24 NOTE — Telephone Encounter (Signed)
 Copied from CRM [id number]. Topic: Clinical - Medication Question >> Dec 24, 2023  9:40 AM Revonda D wrote: Reason for CRM: Patient called.. has taken losartan  (COZAAR ) 25 MG tablet and propranolol  (INDERAL ) 20 MG tablet - she is unsure if either or both causing her to swell.. can she get water pill to help? Said to call if you need to.SABRA  UPDATE: Pt is calling to verify if there was an update regarding this request. Pt would like a callback today with an update.

## 2023-12-24 NOTE — Telephone Encounter (Signed)
 Left voicemail for patient to return call to office.

## 2023-12-25 NOTE — Telephone Encounter (Unsigned)
 Copied from CRM [id number]. Topic: General - Call Back - No Documentation >> Dec 24, 2023  5:11 PM Chiquita SQUIBB wrote: Reason for CRM: Patient is calling Avaletta back, please call the patient back at [phone]

## 2023-12-25 NOTE — Telephone Encounter (Unsigned)
 Copied from CRM [id number]. Topic: General - Other >> Dec 25, 2023 11:18 AM Turkey A wrote: Reason for CRM: Patient was returning call to CMA she said that she can not have her cell phone out while at work. Patient will call back after work.

## 2023-12-25 NOTE — Telephone Encounter (Signed)
 Reached out to patient to ask the questions that Dr. Cleatus wanted to know. Someone picked up and then the phone was disconnected

## 2023-12-25 NOTE — Telephone Encounter (Signed)
 Patient was contacted. Awaiting response from Dr. Cleatus

## 2023-12-25 NOTE — Telephone Encounter (Unsigned)
 Copied from CRM [id number]. Topic: General - Other >> Dec 25, 2023 11:26 AM Revonda D wrote: Reason for CRM: Pt is returning a missed call from Avalette and is requesting to speak with her. I informed the pt that Avalette will callback once available. Pt would like to receive the call on her work number [phone].

## 2023-12-25 NOTE — Telephone Encounter (Signed)
 Patient states that the swelling is in her arms and legs. The swelling begins in the PM typically after she gets home from work. Typically she sits down at her job. No extra salt in diet. Maybe a little shortness of breath but not much.

## 2023-12-29 NOTE — Telephone Encounter (Signed)
 I have been out of clinic unexpectedly.  I think she needs to get rechecked in clinic first, before making changes to her meds.  Thanks.

## 2023-12-29 NOTE — Telephone Encounter (Signed)
 Reached out to patient and offered her an earlier appointment on 8/25 but she declined due to being out of town. Scheduled patient for 01/16/24

## 2024-01-09 ENCOUNTER — Telehealth: Payer: Self-pay

## 2024-01-09 NOTE — Telephone Encounter (Signed)
 Copied from CRM [id number]. Topic: General - Call Back - No Documentation >> Jan 09, 2024 10:31 AM Franky GRADE wrote: Reason for CRM: Patient returning a call from the office; however, no documentation. Patient was not able to the answer the call due to being at work. Her work number is the best number to call during the day  [phone].

## 2024-01-13 NOTE — Telephone Encounter (Signed)
 I do not see any documentation in the chart where attempt to call patient was made.

## 2024-01-16 ENCOUNTER — Ambulatory Visit: Admitting: Family Medicine

## 2024-01-19 ENCOUNTER — Other Ambulatory Visit: Payer: Self-pay | Admitting: Neurological Surgery

## 2024-01-23 ENCOUNTER — Encounter: Payer: Self-pay | Admitting: Family Medicine

## 2024-01-23 ENCOUNTER — Ambulatory Visit: Admitting: Family Medicine

## 2024-01-23 VITALS — BP 120/72 | HR 79 | Temp 98.5°F | Ht 65.0 in | Wt 167.4 lb

## 2024-01-23 DIAGNOSIS — R0789 Other chest pain: Secondary | ICD-10-CM

## 2024-01-23 DIAGNOSIS — I10 Essential (primary) hypertension: Secondary | ICD-10-CM | POA: Diagnosis not present

## 2024-01-23 MED ORDER — LOSARTAN POTASSIUM 25 MG PO TABS
12.5000 mg | ORAL_TABLET | Freq: Every evening | ORAL | Status: AC
Start: 2024-01-23 — End: 2024-04-22

## 2024-01-23 NOTE — Patient Instructions (Signed)
 Okay to take tylenol  with meloxicam .  See if that helps the chest wall pain.   I would hold losartan  for now.  See if you feel better in the next 5-7 days.  If needed, if BP persistently above 140/90, you could restart 1/2 tab a day.   Update me as needed.   Take care.  Glad to see you.

## 2024-01-23 NOTE — Progress Notes (Unsigned)
 No room spinning but can episodically feel lightheaded.  No sx with rolling over in the bed unless she has episode of vertigo and that feels different.  Some BLE edema.  Taking meloxicam  2-3 times per week.  Losartan  was the most recent med start. Still on propranolol  at baseline.   She has back surgery pending.   No exertional chest pain but she has positional pain that is suggestive of chest wall pain, ie talking on the phone and working with her hands at the same time.  Meloxicam  helps.    Meds, vitals, and allergies reviewed.   ROS: Per HPI unless specifically indicated in ROS section   Chest wall ttp Rrr with ectopy.  Ctab No BLE edema Skin well perfused.

## 2024-01-25 NOTE — Assessment & Plan Note (Signed)
 I would hold losartan  for now.  She can see if she feels better in the next 5-7 days.  If needed, if BP persistently above 140/90, you could restart 1/2 tab a day.   Update me as needed.

## 2024-01-25 NOTE — Assessment & Plan Note (Signed)
 Okay to take tylenol  with meloxicam .  See if that helps the chest wall pain.  Update me as needed.

## 2024-03-12 NOTE — Pre-Procedure Instructions (Signed)
 Surgical Instructions   Your procedure is scheduled on March 26, 2024. Report to [facility] Main Entrance A at 10:00 A.M., then check in with the Admitting office. Any questions or running late day of surgery: call [phone]  Questions prior to your surgery date: call [phone], Monday-Friday, 8am-4pm. If you experience any cold or flu symptoms such as cough, fever, chills, shortness of breath, etc. between now and your scheduled surgery, please notify us  at the above number.     Remember:  Do not eat or drink after midnight the night before your surgery. No gum, mints, or hard candy.       Take these medicines the morning of surgery with A SIP OF WATER: buPROPion  (WELLBUTRIN  XL)  omeprazole  (PRILOSEC)  propranolol  (INDERAL )   May take these medicines IF NEEDED: acetaminophen  (TYLENOL )  meclizine  (ANTIVERT )  methocarbamol  (ROBAXIN )  traMADol  (ULTRAM )   One week prior to surgery, STOP taking any Aspirin  (unless otherwise instructed by your surgeon) Aleve , Naproxen , Ibuprofen , Motrin , Advil , Goody's, BC's, all herbal medications, fish oil, and non-prescription vitamins.                     Do NOT Smoke (Tobacco/Vaping) for 24 hours prior to your procedure.  If you use a CPAP at night, you may bring your mask/headgear for your overnight stay.   You will be asked to remove any contacts, glasses, piercing's, hearing aid's, dentures/partials prior to surgery. Please bring cases for these items if needed.    Patients discharged the day of surgery will not be allowed to drive home, and someone needs to stay with them for 24 hours.  SURGICAL WAITING ROOM VISITATION Patients may have no more than 2 support people in the waiting area - these visitors may rotate.   Pre-op nurse will coordinate an appropriate time for 1 ADULT support person, who may not rotate, to accompany patient in pre-op.  Children under the age of 63 must have an adult with them who is not the patient and  must remain in the main waiting area with an adult.  If the patient needs to stay at the hospital during part of their recovery, the visitor guidelines for inpatient rooms apply.  Please refer to the [facility] website for the visitor guidelines for any additional information.   If you received a COVID test during your pre-op visit  it is requested that you wear a mask when out in public, stay away from anyone that may not be feeling well and notify your surgeon if you develop symptoms. If you have been in contact with anyone that has tested positive in the last 10 days please notify you surgeon.      Pre-operative 4 CHG Bathing Instructions   You can play a key role in reducing the risk of infection after surgery. Your skin needs to be as free of germs as possible. You can reduce the number of germs on your skin by washing with CHG (chlorhexidine  gluconate) soap before surgery. CHG is an antiseptic soap that kills germs and continues to kill germs even after washing.   DO NOT use if you have an allergy to chlorhexidine /CHG or antibacterial soaps. If your skin becomes reddened or irritated, stop using the CHG and notify one of our RNs at [phone].   Please shower with the CHG soap starting 4 days before surgery using the following schedule:     Please keep in mind the following:  DO NOT shave, including legs and  underarms, starting the day of your first shower.   You may shave your face at any point before/day of surgery.  Place clean sheets on your bed the day you start using CHG soap. Use a clean washcloth (not used since being washed) for each shower. DO NOT sleep with pets once you start using the CHG.   CHG Shower Instructions:  Wash your face and private area with normal soap. If you choose to wash your hair, wash first with your normal shampoo.  After you use shampoo/soap, rinse your hair and body thoroughly to remove shampoo/soap residue.  Turn the water OFF and apply   bottle of CHG soap to a CLEAN washcloth.  Apply CHG soap ONLY FROM YOUR NECK DOWN TO YOUR TOES (washing for 3-5 minutes)  DO NOT use CHG soap on face, private areas, open wounds, or sores.  Pay special attention to the area where your surgery is being performed.  If you are having back surgery, having someone wash your back for you may be helpful. Wait 2 minutes after CHG soap is applied, then you may rinse off the CHG soap.  Pat dry with a clean towel  Put on clean clothes/pajamas   If you choose to wear lotion, please use ONLY the CHG-compatible lotions that are listed below.  Additional instructions for the day of surgery:  If you choose, you may shower the morning of surgery with an antibacterial soap.  DO NOT APPLY any lotions, deodorants, cologne, or perfumes.   Do not bring valuables to the hospital. [facility] is not responsible for any belongings/valuables. Do not wear nail polish, gel polish, artificial nails, or any other type of covering on natural nails (fingers and toes) Do not wear jewelry or makeup Put on clean/comfortable clothes.  Please brush your teeth.  Ask your nurse before applying any prescription medications to the skin.     CHG Compatible Lotions   Aveeno Moisturizing lotion  Cetaphil Moisturizing Cream  Cetaphil Moisturizing Lotion  Clairol Herbal Essence Moisturizing Lotion, Dry Skin  Clairol Herbal Essence Moisturizing Lotion, Extra Dry Skin  Clairol Herbal Essence Moisturizing Lotion, Normal Skin  Curel Age Defying Therapeutic Moisturizing Lotion with Alpha Hydroxy  Curel Extreme Care Body Lotion  Curel Soothing Hands Moisturizing Hand Lotion  Curel Therapeutic Moisturizing Cream, Fragrance-Free  Curel Therapeutic Moisturizing Lotion, Fragrance-Free  Curel Therapeutic Moisturizing Lotion, Original Formula  Eucerin Daily Replenishing Lotion  Eucerin Dry Skin Therapy Plus Alpha Hydroxy Crme  Eucerin Dry Skin Therapy Plus Alpha Hydroxy Lotion   Eucerin Original Crme  Eucerin Original Lotion  Eucerin Plus Crme Eucerin Plus Lotion  Eucerin TriLipid Replenishing Lotion  Keri Anti-Bacterial Hand Lotion  Keri Deep Conditioning Original Lotion Dry Skin Formula Softly Scented  Keri Deep Conditioning Original Lotion, Fragrance Free Sensitive Skin Formula  Keri Lotion Fast Absorbing Fragrance Free Sensitive Skin Formula  Keri Lotion Fast Absorbing Softly Scented Dry Skin Formula  Keri Original Lotion  Keri Skin Renewal Lotion Keri Silky Smooth Lotion  Keri Silky Smooth Sensitive Skin Lotion  Nivea Body Creamy Conditioning Oil  Nivea Body Extra Enriched Lotion  Nivea Body Original Lotion  Nivea Body Sheer Moisturizing Lotion Nivea Crme  Nivea Skin Firming Lotion  NutraDerm 30 Skin Lotion  NutraDerm Skin Lotion  NutraDerm Therapeutic Skin Cream  NutraDerm Therapeutic Skin Lotion  ProShield Protective Hand Cream  Provon moisturizing lotion  Please read over the following fact sheets that you were given.

## 2024-03-15 ENCOUNTER — Encounter (HOSPITAL_COMMUNITY): Payer: Self-pay

## 2024-03-15 ENCOUNTER — Encounter (HOSPITAL_COMMUNITY)
Admission: RE | Admit: 2024-03-15 | Discharge: 2024-03-15 | Disposition: A | Discharge status: Home / Self Care | Source: Ambulatory Visit | Attending: Neurological Surgery | Admitting: Neurological Surgery

## 2024-03-15 ENCOUNTER — Other Ambulatory Visit: Payer: Self-pay

## 2024-03-15 VITALS — BP 143/74 | HR 67 | Temp 98.4°F | Resp 16 | Ht 66.0 in | Wt 168.7 lb

## 2024-03-15 DIAGNOSIS — Z01818 Encounter for other preprocedural examination: Secondary | ICD-10-CM

## 2024-03-15 DIAGNOSIS — Z01812 Encounter for preprocedural laboratory examination: Secondary | ICD-10-CM | POA: Insufficient documentation

## 2024-03-15 DIAGNOSIS — I10 Essential (primary) hypertension: Secondary | ICD-10-CM | POA: Diagnosis not present

## 2024-03-15 HISTORY — DX: Unspecified osteoarthritis, unspecified site: M19.90

## 2024-03-15 LAB — BASIC METABOLIC PANEL WITH GFR
Anion gap: 10 (ref 5–15)
BUN: 10 mg/dL (ref 8–23)
CO2: 23 mmol/L (ref 22–32)
Calcium: 8.8 mg/dL — ABNORMAL LOW (ref 8.9–10.3)
Chloride: 103 mmol/L (ref 98–111)
Creatinine, Ser: 0.81 mg/dL (ref 0.44–1.00)
GFR, Estimated: >60 mL/min (ref >60)
Glucose, Bld: 88 mg/dL (ref 70–99)
Potassium: 3.6 mmol/L (ref 3.5–5.1)
Sodium: 136 mmol/L (ref 135–145)

## 2024-03-15 LAB — TYPE AND SCREEN
ABO/RH(D): O POS
Antibody Screen: NEGATIVE

## 2024-03-15 LAB — CBC
HCT: 42.1 % (ref 36.0–46.0)
Hemoglobin: 13.1 g/dL (ref 12.0–15.0)
MCH: 24.4 pg — ABNORMAL LOW (ref 26.0–34.0)
MCHC: 31.1 g/dL (ref 30.0–36.0)
MCV: 78.4 fL — ABNORMAL LOW (ref 80.0–100.0)
Platelets: 268 K/uL (ref 150–400)
RBC: 5.37 MIL/uL — ABNORMAL HIGH (ref 3.87–5.11)
RDW: 13.2 % (ref 11.5–15.5)
WBC: 9.2 K/uL (ref 4.0–10.5)
nRBC: 0 % (ref 0.0–0.2)

## 2024-03-15 LAB — SURGICAL PCR SCREEN
MRSA, PCR: NEGATIVE
Staphylococcus aureus: POSITIVE — AB

## 2024-03-15 NOTE — Progress Notes (Signed)
 PCP - Dr. Arlyss Solian Cardiologist - Dr. Ladona  Chest x-ray -  EKG - 11/21/23 Stress Test - 10/24/15 ECHO - 01/07/21 Cardiac Cath - 09/06/15  Sleep Study - denies CPAP -   Fasting Blood Sugar - n/a Checks Blood Sugar _____ times a day  Last dose of GLP1 agonist-  n/a GLP1 instructions:   Blood Thinner Instructions: n/a Aspirin  Instructions:  ERAS Protcol - NPO PRE-SURGERY Ensure or G2-     Patient denies shortness of breath, fever, cough and chest pain at PAT appointment

## 2024-03-25 NOTE — Progress Notes (Signed)
------------------------------------------  [facility] PROCEDURAL EXPEDITER NOTE-------------------------------------------------  Patient Name: Kathy Avila Patient DOB: 08/06/1960 Today's Date: @TODAY @   Chart reviewed:  Yes  Documentation gaps: n/a Orders in place:  Yes  Communication with surgical team if no orders: n/a Labs, test, and orders reviewed: yes Requires surgical clearance:  No What type of clearance: n/a Clearance received: n/a Patient status:pre op   Barriers noted:n/a  Intervention provided by [facility] team: n/a Barrier resolved:  Yes   Ronal Bald, RN [employer]

## 2024-03-26 ENCOUNTER — Encounter (HOSPITAL_COMMUNITY)
Admission: RE | Disposition: A | Discharge status: Home / Self Care | Payer: Self-pay | Source: Home / Self Care | Attending: Neurological Surgery

## 2024-03-26 ENCOUNTER — Ambulatory Visit (HOSPITAL_COMMUNITY)

## 2024-03-26 ENCOUNTER — Encounter (HOSPITAL_COMMUNITY): Payer: Self-pay | Admitting: Neurological Surgery

## 2024-03-26 ENCOUNTER — Ambulatory Visit (HOSPITAL_BASED_OUTPATIENT_CLINIC_OR_DEPARTMENT_OTHER): Admitting: Anesthesiology

## 2024-03-26 ENCOUNTER — Ambulatory Visit (HOSPITAL_COMMUNITY): Admitting: Anesthesiology

## 2024-03-26 ENCOUNTER — Other Ambulatory Visit: Payer: Self-pay

## 2024-03-26 ENCOUNTER — Observation Stay (HOSPITAL_COMMUNITY)
Admission: RE | Admit: 2024-03-26 | Discharge: 2024-03-27 | Disposition: A | Discharge status: Home / Self Care | Attending: Neurological Surgery | Admitting: Neurological Surgery

## 2024-03-26 DIAGNOSIS — F418 Other specified anxiety disorders: Secondary | ICD-10-CM | POA: Diagnosis not present

## 2024-03-26 DIAGNOSIS — M48061 Spinal stenosis, lumbar region without neurogenic claudication: Secondary | ICD-10-CM

## 2024-03-26 DIAGNOSIS — I10 Essential (primary) hypertension: Secondary | ICD-10-CM

## 2024-03-26 DIAGNOSIS — E785 Hyperlipidemia, unspecified: Secondary | ICD-10-CM | POA: Diagnosis not present

## 2024-03-26 DIAGNOSIS — Z79899 Other long term (current) drug therapy: Secondary | ICD-10-CM | POA: Diagnosis not present

## 2024-03-26 DIAGNOSIS — Z981 Arthrodesis status: Principal | ICD-10-CM

## 2024-03-26 DIAGNOSIS — M4856XA Collapsed vertebra, not elsewhere classified, lumbar region, initial encounter for fracture: Secondary | ICD-10-CM | POA: Diagnosis not present

## 2024-03-26 DIAGNOSIS — M48062 Spinal stenosis, lumbar region with neurogenic claudication: Principal | ICD-10-CM | POA: Insufficient documentation

## 2024-03-26 HISTORY — PX: LAMINECTOMY WITH POSTERIOR LATERAL ARTHRODESIS LEVEL 3: SHX6337

## 2024-03-26 SURGERY — LAMINECTOMY WITH POSTERIOR LATERAL ARTHRODESIS LEVEL 3
Anesthesia: General | Site: Back

## 2024-03-26 MED ORDER — KETAMINE HCL 50 MG/5ML IJ SOSY
PREFILLED_SYRINGE | INTRAMUSCULAR | Status: AC
  Filled 2024-03-26: qty 5

## 2024-03-26 MED ORDER — DEXAMETHASONE SOD PHOSPHATE PF 10 MG/ML IJ SOLN
INTRAMUSCULAR | Status: DC | PRN
Start: 2024-03-26 — End: 2024-03-26
  Administered 2024-03-26: 10 mg via INTRAVENOUS

## 2024-03-26 MED ORDER — POTASSIUM CHLORIDE IN NACL 20-0.9 MEQ/L-% IV SOLN
INTRAVENOUS | Status: DC
  Filled 2024-03-26: qty 1000

## 2024-03-26 MED ORDER — ADULT MULTIVITAMIN W/MINERALS CH
1.0000 | ORAL_TABLET | Freq: Every day | ORAL | Status: DC
  Administered 2024-03-26: 1 via ORAL
  Filled 2024-03-26: qty 1

## 2024-03-26 MED ORDER — CHLORHEXIDINE GLUCONATE 0.12 % MT SOLN
OROMUCOSAL | Status: AC
  Administered 2024-03-26: 15 mL via OROMUCOSAL
  Filled 2024-03-26: qty 15

## 2024-03-26 MED ORDER — FENTANYL CITRATE (PF) 250 MCG/5ML IJ SOLN
INTRAMUSCULAR | Status: AC
  Filled 2024-03-26: qty 5

## 2024-03-26 MED ORDER — ONDANSETRON HCL 4 MG/2ML IJ SOLN
INTRAMUSCULAR | Status: DC | PRN
Start: 2024-03-26 — End: 2024-03-26
  Administered 2024-03-26: 4 mg via INTRAVENOUS

## 2024-03-26 MED ORDER — THROMBIN 20000 UNITS EX SOLR: CUTANEOUS | Status: DC | PRN

## 2024-03-26 MED ORDER — PHENOL 1.4 % MT LIQD
1.0000 | OROMUCOSAL | Status: DC | PRN
  Administered 2024-03-26: 1 via OROMUCOSAL
  Filled 2024-03-26: qty 177

## 2024-03-26 MED ORDER — KETAMINE HCL 10 MG/ML IJ SOLN
INTRAMUSCULAR | Status: DC | PRN
  Administered 2024-03-26: 25 mg via INTRAVENOUS

## 2024-03-26 MED ORDER — PANTOPRAZOLE SODIUM 40 MG PO TBEC
40.0000 mg | DELAYED_RELEASE_TABLET | Freq: Every day | ORAL | Status: DC
  Administered 2024-03-27: 40 mg via ORAL
  Filled 2024-03-26: qty 1

## 2024-03-26 MED ORDER — ROCURONIUM BROMIDE 10 MG/ML (PF) SYRINGE
PREFILLED_SYRINGE | INTRAVENOUS | Status: AC
  Filled 2024-03-26: qty 10

## 2024-03-26 MED ORDER — HYDROMORPHONE HCL 1 MG/ML IJ SOLN
0.2500 mg | INTRAMUSCULAR | Status: DC | PRN
  Administered 2024-03-26 (×2): 0.5 mg via INTRAVENOUS

## 2024-03-26 MED ORDER — VITAMIN E 45 MG (100 UNIT) PO CAPS
400.0000 [IU] | ORAL_CAPSULE | Freq: Every day | ORAL | Status: DC
  Filled 2024-03-26: qty 4

## 2024-03-26 MED ORDER — CEFAZOLIN SODIUM-DEXTROSE 2-4 GM/100ML-% IV SOLN
2.0000 g | INTRAVENOUS | Status: AC
  Administered 2024-03-26: 2 g via INTRAVENOUS

## 2024-03-26 MED ORDER — ACETAMINOPHEN 500 MG PO TABS
ORAL_TABLET | ORAL | Status: AC
Start: 2024-03-26 — End: 2024-03-26
  Administered 2024-03-26: 1000 mg via ORAL
  Filled 2024-03-26: qty 2

## 2024-03-26 MED ORDER — GABAPENTIN 300 MG PO CAPS: 300.0000 mg | ORAL_CAPSULE | ORAL | Status: AC

## 2024-03-26 MED ORDER — CHLORHEXIDINE GLUCONATE CLOTH 2 % EX PADS: 6.0000 | MEDICATED_PAD | Freq: Once | CUTANEOUS | Status: DC

## 2024-03-26 MED ORDER — CELECOXIB 200 MG PO CAPS
200.0000 mg | ORAL_CAPSULE | Freq: Two times a day (BID) | ORAL | Status: DC
  Administered 2024-03-26 – 2024-03-27 (×2): 200 mg via ORAL
  Filled 2024-03-26 (×2): qty 1

## 2024-03-26 MED ORDER — PROPOFOL 10 MG/ML IV BOLUS
INTRAVENOUS | Status: DC | PRN
  Administered 2024-03-26: 100 mg via INTRAVENOUS

## 2024-03-26 MED ORDER — MENTHOL 3 MG MT LOZG: 1.0000 | LOZENGE | OROMUCOSAL | Status: DC | PRN

## 2024-03-26 MED ORDER — ONDANSETRON HCL 4 MG/2ML IJ SOLN
4.0000 mg | Freq: Four times a day (QID) | INTRAMUSCULAR | Status: DC | PRN
  Administered 2024-03-26: 4 mg via INTRAVENOUS
  Filled 2024-03-26: qty 2

## 2024-03-26 MED ORDER — OXYCODONE HCL 5 MG PO TABS: 5.0000 mg | ORAL_TABLET | Freq: Once | ORAL | Status: DC | PRN

## 2024-03-26 MED ORDER — CHLORHEXIDINE GLUCONATE 4 % EX SOLN: 1.0000 | CUTANEOUS | 1 refills | Status: AC

## 2024-03-26 MED ORDER — MUPIROCIN 2 % EX OINT: 1.0000 | TOPICAL_OINTMENT | Freq: Two times a day (BID) | CUTANEOUS | 0 refills | Status: AC

## 2024-03-26 MED ORDER — ROCURONIUM BROMIDE 10 MG/ML (PF) SYRINGE
PREFILLED_SYRINGE | INTRAVENOUS | Status: DC | PRN
  Administered 2024-03-26: 60 mg via INTRAVENOUS
  Administered 2024-03-26: 30 mg via INTRAVENOUS

## 2024-03-26 MED ORDER — THROMBIN 5000 UNITS EX KIT
PACK | CUTANEOUS | Status: AC
  Filled 2024-03-26: qty 1

## 2024-03-26 MED ORDER — METHOCARBAMOL 500 MG PO TABS
500.0000 mg | ORAL_TABLET | Freq: Three times a day (TID) | ORAL | Status: DC | PRN
Start: 2024-03-26 — End: 2024-03-27
  Administered 2024-03-26 – 2024-03-27 (×3): 500 mg via ORAL
  Filled 2024-03-26 (×3): qty 1

## 2024-03-26 MED ORDER — PROPOFOL 10 MG/ML IV BOLUS
INTRAVENOUS | Status: AC
Start: 2024-03-26 — End: 2024-03-26
  Filled 2024-03-26: qty 20

## 2024-03-26 MED ORDER — PHENYLEPHRINE 80 MCG/ML (10ML) SYRINGE FOR IV PUSH (FOR BLOOD PRESSURE SUPPORT)
PREFILLED_SYRINGE | INTRAVENOUS | Status: DC | PRN
Start: 2024-03-26 — End: 2024-03-26
  Administered 2024-03-26 (×2): 160 ug via INTRAVENOUS

## 2024-03-26 MED ORDER — THROMBIN 5000 UNITS EX SOLR: OROMUCOSAL | Status: DC | PRN

## 2024-03-26 MED ORDER — ONDANSETRON HCL 4 MG/2ML IJ SOLN
INTRAMUSCULAR | Status: AC
Start: 2024-03-26 — End: 2024-03-26
  Filled 2024-03-26: qty 2

## 2024-03-26 MED ORDER — BUPROPION HCL ER (XL) 300 MG PO TB24
300.0000 mg | ORAL_TABLET | Freq: Every day | ORAL | Status: DC
  Administered 2024-03-27: 300 mg via ORAL
  Filled 2024-03-26: qty 1

## 2024-03-26 MED ORDER — ACETAMINOPHEN 500 MG PO TABS
1000.0000 mg | ORAL_TABLET | Freq: Four times a day (QID) | ORAL | Status: DC
  Administered 2024-03-26 – 2024-03-27 (×3): 1000 mg via ORAL
  Filled 2024-03-26 (×4): qty 2

## 2024-03-26 MED ORDER — AMISULPRIDE (ANTIEMETIC) 5 MG/2ML IV SOLN
10.0000 mg | Freq: Once | INTRAVENOUS | Status: AC | PRN
  Administered 2024-03-26: 10 mg via INTRAVENOUS

## 2024-03-26 MED ORDER — SENNA 8.6 MG PO TABS
1.0000 | ORAL_TABLET | Freq: Two times a day (BID) | ORAL | Status: DC
  Administered 2024-03-26 – 2024-03-27 (×2): 8.6 mg via ORAL
  Filled 2024-03-26 (×2): qty 1

## 2024-03-26 MED ORDER — MIDAZOLAM HCL (PF) 2 MG/2ML IJ SOLN
INTRAMUSCULAR | Status: DC | PRN
  Administered 2024-03-26: 2 mg via INTRAVENOUS

## 2024-03-26 MED ORDER — ACETAMINOPHEN 10 MG/ML IV SOLN: 1000.0000 mg | Freq: Once | INTRAVENOUS | Status: DC | PRN

## 2024-03-26 MED ORDER — PHENYLEPHRINE 80 MCG/ML (10ML) SYRINGE FOR IV PUSH (FOR BLOOD PRESSURE SUPPORT)
PREFILLED_SYRINGE | INTRAVENOUS | Status: AC
  Filled 2024-03-26: qty 10

## 2024-03-26 MED ORDER — LIDOCAINE 2% (20 MG/ML) 5 ML SYRINGE
INTRAMUSCULAR | Status: AC
  Filled 2024-03-26: qty 5

## 2024-03-26 MED ORDER — ONDANSETRON HCL 4 MG PO TABS
4.0000 mg | ORAL_TABLET | Freq: Four times a day (QID) | ORAL | Status: DC | PRN
  Administered 2024-03-27: 4 mg via ORAL
  Filled 2024-03-26 (×2): qty 1

## 2024-03-26 MED ORDER — 0.9 % SODIUM CHLORIDE (POUR BTL) OPTIME
TOPICAL | Status: DC | PRN
  Administered 2024-03-26: 1000 mL

## 2024-03-26 MED ORDER — LOSARTAN POTASSIUM 25 MG PO TABS
12.5000 mg | ORAL_TABLET | Freq: Every evening | ORAL | Status: DC
  Filled 2024-03-26 (×2): qty 1

## 2024-03-26 MED ORDER — LACTATED RINGERS IV SOLN: INTRAVENOUS | Status: DC | PRN

## 2024-03-26 MED ORDER — PHENYLEPHRINE HCL-NACL 20-0.9 MG/250ML-% IV SOLN
INTRAVENOUS | Status: DC | PRN
  Administered 2024-03-26: 20 ug/min via INTRAVENOUS

## 2024-03-26 MED ORDER — ORAL CARE MOUTH RINSE: 15.0000 mL | Freq: Once | OROMUCOSAL | Status: AC

## 2024-03-26 MED ORDER — SUGAMMADEX SODIUM 200 MG/2ML IV SOLN
INTRAVENOUS | Status: DC | PRN
  Administered 2024-03-26: 200 mg via INTRAVENOUS

## 2024-03-26 MED ORDER — MIDAZOLAM HCL 2 MG/2ML IJ SOLN
INTRAMUSCULAR | Status: AC
  Filled 2024-03-26: qty 2

## 2024-03-26 MED ORDER — AMISULPRIDE (ANTIEMETIC) 5 MG/2ML IV SOLN
INTRAVENOUS | Status: AC
  Filled 2024-03-26: qty 4

## 2024-03-26 MED ORDER — SODIUM CHLORIDE 0.9% FLUSH: 3.0000 mL | INTRAVENOUS | Status: DC | PRN

## 2024-03-26 MED ORDER — CEFAZOLIN SODIUM-DEXTROSE 2-4 GM/100ML-% IV SOLN
INTRAVENOUS | Status: AC
  Filled 2024-03-26: qty 100

## 2024-03-26 MED ORDER — OXYCODONE HCL 5 MG PO TABS
5.0000 mg | ORAL_TABLET | ORAL | Status: DC | PRN
  Administered 2024-03-26: 5 mg via ORAL
  Filled 2024-03-26 (×2): qty 1

## 2024-03-26 MED ORDER — GABAPENTIN 300 MG PO CAPS
ORAL_CAPSULE | ORAL | Status: AC
  Administered 2024-03-26: 300 mg via ORAL
  Filled 2024-03-26: qty 1

## 2024-03-26 MED ORDER — BUPIVACAINE HCL (PF) 0.25 % IJ SOLN
INTRAMUSCULAR | Status: AC
Start: 2024-03-26 — End: 2024-03-26
  Filled 2024-03-26: qty 30

## 2024-03-26 MED ORDER — CHLORHEXIDINE GLUCONATE 0.12 % MT SOLN: 15.0000 mL | Freq: Once | OROMUCOSAL | Status: AC

## 2024-03-26 MED ORDER — OXYCODONE HCL 5 MG PO TABS
10.0000 mg | ORAL_TABLET | ORAL | Status: DC | PRN
  Administered 2024-03-26 – 2024-03-27 (×3): 10 mg via ORAL
  Filled 2024-03-26 (×4): qty 2

## 2024-03-26 MED ORDER — SODIUM CHLORIDE 0.9 % IR SOLN
Status: DC | PRN
  Administered 2024-03-26: 500 mL

## 2024-03-26 MED ORDER — SODIUM CHLORIDE 0.9% FLUSH
3.0000 mL | Freq: Two times a day (BID) | INTRAVENOUS | Status: DC
  Administered 2024-03-26: 3 mL via INTRAVENOUS

## 2024-03-26 MED ORDER — THROMBIN 20000 UNITS EX SOLR
CUTANEOUS | Status: AC
  Filled 2024-03-26: qty 20000

## 2024-03-26 MED ORDER — SODIUM CHLORIDE 0.9 % IV SOLN
250.0000 mL | INTRAVENOUS | Status: DC
  Administered 2024-03-26: 250 mL via INTRAVENOUS

## 2024-03-26 MED ORDER — PROPRANOLOL HCL 20 MG PO TABS
20.0000 mg | ORAL_TABLET | Freq: Every day | ORAL | Status: DC
  Filled 2024-03-26: qty 2
  Filled 2024-03-26: qty 1

## 2024-03-26 MED ORDER — LACTATED RINGERS IV SOLN: INTRAVENOUS | Status: DC

## 2024-03-26 MED ORDER — MORPHINE SULFATE (PF) 2 MG/ML IV SOLN
2.0000 mg | INTRAVENOUS | Status: DC | PRN
  Administered 2024-03-26: 2 mg via INTRAVENOUS
  Filled 2024-03-26: qty 1

## 2024-03-26 MED ORDER — FENTANYL CITRATE (PF) 250 MCG/5ML IJ SOLN
INTRAMUSCULAR | Status: DC | PRN
  Administered 2024-03-26: 50 ug via INTRAVENOUS
  Administered 2024-03-26: 100 ug via INTRAVENOUS
  Administered 2024-03-26: 50 ug via INTRAVENOUS

## 2024-03-26 MED ORDER — CEFAZOLIN SODIUM-DEXTROSE 2-4 GM/100ML-% IV SOLN
2.0000 g | Freq: Three times a day (TID) | INTRAVENOUS | Status: AC
  Administered 2024-03-26 – 2024-03-27 (×2): 2 g via INTRAVENOUS
  Filled 2024-03-26 (×2): qty 100

## 2024-03-26 MED ORDER — OXYCODONE HCL 5 MG/5ML PO SOLN: 5.0000 mg | Freq: Once | ORAL | Status: DC | PRN

## 2024-03-26 MED ORDER — HYDROMORPHONE HCL 1 MG/ML IJ SOLN
INTRAMUSCULAR | Status: AC
  Filled 2024-03-26: qty 1

## 2024-03-26 MED ORDER — BUPIVACAINE HCL (PF) 0.25 % IJ SOLN
INTRAMUSCULAR | Status: DC | PRN
  Administered 2024-03-26: 7 mL

## 2024-03-26 MED ORDER — ACETAMINOPHEN 500 MG PO TABS: 1000.0000 mg | ORAL_TABLET | ORAL | Status: AC

## 2024-03-26 MED ORDER — ONDANSETRON HCL 4 MG/2ML IJ SOLN: 4.0000 mg | Freq: Once | INTRAMUSCULAR | Status: DC | PRN

## 2024-03-26 SURGICAL SUPPLY — 55 items
ALLOGRAFT BONE FIBER KORE 5 (Bone Implant) IMPLANT
BAG COUNTER SPONGE SURGICOUNT (BAG) ×1 IMPLANT
BASKET BONE COLLECTION (BASKET) IMPLANT
BENZOIN TINCTURE PRP APPL 2/3 (GAUZE/BANDAGES/DRESSINGS) ×1 IMPLANT
BLADE BONE MILL MEDIUM (MISCELLANEOUS) IMPLANT
BLADE CLIPPER SURG (BLADE) IMPLANT
BUR CARBIDE MATCH 3.0 (BURR) ×1 IMPLANT
CANISTER SUCTION 3000ML PPV (SUCTIONS) ×1 IMPLANT
CNTNR URN SCR LID CUP LEK RST (MISCELLANEOUS) ×1 IMPLANT
COVER BACK TABLE 60X90IN (DRAPES) ×1 IMPLANT
DERMABOND ADVANCED .7 DNX12 (GAUZE/BANDAGES/DRESSINGS) IMPLANT
DRAPE C-ARM 42X72 X-RAY (DRAPES) IMPLANT
DRAPE LAPAROTOMY 100X72X124 (DRAPES) ×1 IMPLANT
DRAPE SURG 17X23 STRL (DRAPES) ×1 IMPLANT
DRSG AQUACEL AG ADV 3.5X 6 (GAUZE/BANDAGES/DRESSINGS) ×1 IMPLANT
DRSG OPSITE POSTOP 4X6 (GAUZE/BANDAGES/DRESSINGS) IMPLANT
DURAPREP 26ML APPLICATOR (WOUND CARE) ×1 IMPLANT
ELECTRODE REM PT RTRN 9FT ADLT (ELECTROSURGICAL) ×1 IMPLANT
EVACUATOR 1/8 PVC DRAIN (DRAIN) IMPLANT
GAUZE 4X4 16PLY ~~LOC~~+RFID DBL (SPONGE) IMPLANT
GAUZE SPONGE 4X4 12PLY STRL (GAUZE/BANDAGES/DRESSINGS) IMPLANT
GLOVE BIO SURGEON STRL SZ7 (GLOVE) ×1 IMPLANT
GLOVE BIO SURGEON STRL SZ8 (GLOVE) ×2 IMPLANT
GLOVE BIOGEL PI IND STRL 7.0 (GLOVE) IMPLANT
GOWN STRL REUS W/ TWL LRG LVL3 (GOWN DISPOSABLE) IMPLANT
GOWN STRL REUS W/ TWL XL LVL3 (GOWN DISPOSABLE) ×2 IMPLANT
GOWN STRL REUS W/TWL 2XL LVL3 (GOWN DISPOSABLE) IMPLANT
GRAFT BN 10X1XDBM MAGNIFUSE (Bone Implant) IMPLANT
HEMOSTAT POWDER KIT SURGIFOAM (HEMOSTASIS) ×1 IMPLANT
KIT BASIN OR (CUSTOM PROCEDURE TRAY) ×1 IMPLANT
KIT INFUSE SMALL (Orthopedic Implant) IMPLANT
KIT TURNOVER KIT B (KITS) ×1 IMPLANT
MILL BONE PREP (MISCELLANEOUS) IMPLANT
NDL HYPO 25X1 1.5 SAFETY (NEEDLE) ×1 IMPLANT
NEEDLE HYPO 25X1 1.5 SAFETY (NEEDLE) ×1 IMPLANT
PACK LAMINECTOMY NEURO (CUSTOM PROCEDURE TRAY) ×1 IMPLANT
PAD ARMBOARD POSITIONER FOAM (MISCELLANEOUS) ×3 IMPLANT
PATTIES SURGICAL 1X1 (DISPOSABLE) IMPLANT
ROD LORD LIPPED TI 5.5X90 (Rod) IMPLANT
SCREW CANC SHANK MOD 5.5X45 (Screw) IMPLANT
SCREW POLYAXIAL TULIP (Screw) IMPLANT
SEALANT ADHERUS EXTEND TIP (MISCELLANEOUS) IMPLANT
SET SCREW SPNE (Screw) IMPLANT
SOLN 0.9% NACL POUR BTL 1000ML (IV SOLUTION) ×1 IMPLANT
SOLN STERILE WATER BTL 1000 ML (IV SOLUTION) ×1 IMPLANT
SOLUTION IRRIG SURGIPHOR (IV SOLUTION) ×1 IMPLANT
SPONGE SURGIFOAM ABS GEL 100 (HEMOSTASIS) ×1 IMPLANT
SPONGE T-LAP 4X18 ~~LOC~~+RFID (SPONGE) IMPLANT
STRIP CLOSURE SKIN 1/2X4 (GAUZE/BANDAGES/DRESSINGS) ×2 IMPLANT
SUT PROLENE 6 0 BV (SUTURE) IMPLANT
SUT VIC AB 0 CT1 18XCR BRD8 (SUTURE) ×1 IMPLANT
SUT VIC AB 2-0 CP2 18 (SUTURE) ×1 IMPLANT
SUT VIC AB 3-0 SH 8-18 (SUTURE) ×2 IMPLANT
TOWEL GREEN STERILE (TOWEL DISPOSABLE) ×1 IMPLANT
TOWEL GREEN STERILE FF (TOWEL DISPOSABLE) ×1 IMPLANT

## 2024-03-26 NOTE — H&P (Signed)
 Subjective: Patient is a 63 y.o. female admitted for L3 fracture with spinal stenosis. Onset of symptoms was several months ago, gradually worsening since that time.  The pain is rated severe, and is located at the across the lower back and radiates to hips and legs. The pain is described as aching and occurs all day. The symptoms have been progressive. Symptoms are exacerbated by exercise, standing, and walking for more than a few minutes. MRI or CT showed as above   Past Medical History:  Diagnosis Date   Anxiety    Arthritis    B12 deficiency    Coronary artery disease    Depression    GERD (gastroesophageal reflux disease)    Headache    Hyperlipidemia    Menopausal symptoms    PONV (postoperative nausea and vomiting)    Wears contact lenses     Past Surgical History:  Procedure Laterality Date   ABDOMINAL HYSTERECTOMY     ANKLE RECONSTRUCTION Bilateral    ANTERIOR CERVICAL DECOMPRESSION/DISCECTOMY FUSION 4 LEVELS N/A 01/09/2018   Procedure: CERVICAL THREE-FOUR, CERVICAL FOUR-FIVE, CERVICAL FIVE-SIX, CERVICAL SIX-SEVEN ANTERIOR CERVICAL DECOMPRESSION/DISCECTOMY FUSION;  Surgeon: Joshua Alm RAMAN, MD;  Location: [facility] OR;  Service: Neurosurgery;  Laterality: N/A;   CARDIAC CATHETERIZATION N/A 09/06/2015   Procedure: Left Heart Cath and Coronary Angiography;  Surgeon: Wolm JINNY Rhyme, MD;  Location: [facility] INVASIVE CV LAB;  Service: Cardiovascular;  Laterality: N/A;   DILATION AND CURETTAGE OF UTERUS     TONSILLECTOMY      Prior to Admission medications   Medication Sig Start Date End Date Taking? Authorizing Provider  acetaminophen  (TYLENOL ) 500 MG tablet Take 1,000 mg by mouth every 6 (six) hours as needed for moderate pain (pain score 4-6).   Yes [provider]  atorvastatin  (LIPITOR) 10 MG tablet Take 1 tablet (10 mg total) by mouth at bedtime. 08/14/23  Yes Cleatus Arlyss RAMAN, MD  buPROPion  (WELLBUTRIN  XL) 300 MG 24 hr tablet Take 1 tablet (300 mg total) by mouth daily. 08/14/23   Yes Cleatus Arlyss RAMAN, MD  cholecalciferol  (VITAMIN D ) 1000 units tablet Take 1,000 Units by mouth daily.   Yes [provider]  cyanocobalamin (VITAMIN B12) 500 MCG tablet Take 500 mcg by mouth every Monday, Wednesday, and Friday.   Yes [provider]  estradiol  (MENOSTAR ) 14 MCG/24HR Place 1 patch onto the skin 2 (two) times a week. 05/10/22  Yes [provider]  meloxicam  (MOBIC ) 7.5 MG tablet Take 1 tablet (7.5 mg total) by mouth 2 (two) times daily. With food. Patient taking differently: Take 7.5 mg by mouth 2 (two) times daily as needed for pain. 08/14/23  Yes Cleatus Arlyss RAMAN, MD  methocarbamol  (ROBAXIN ) 500 MG tablet Take 1 tablet (500 mg total) by mouth every 8 (eight) hours as needed. 08/14/23  Yes Cleatus Arlyss RAMAN, MD  Multiple Vitamin (MULTIVITAMIN WITH MINERALS) TABS tablet Take 1 tablet by mouth daily.   Yes [provider]  omeprazole  (PRILOSEC) 40 MG capsule Take 1 capsule (40 mg total) by mouth daily. 08/14/23  Yes Cleatus Arlyss RAMAN, MD  propranolol  (INDERAL ) 20 MG tablet Take 1 tablet (20 mg total) by mouth 2 (two) times daily. Patient taking differently: Take 20 mg by mouth daily. 11/30/23  Yes Cleatus Arlyss RAMAN, MD  traMADol  (ULTRAM ) 50 MG tablet Take 0.5-1 tablets (25-50 mg total) by mouth every 6 (six) hours as needed for moderate pain (pain score 4-6). Patient taking differently: Take 25-50 mg by mouth at bedtime as  needed for moderate pain (pain score 4-6). 11/28/23  Yes Cleatus Arlyss RAMAN, MD  vitamin E 400 UNIT capsule Take 400 Units by mouth daily.   Yes [provider]  losartan  (COZAAR ) 25 MG tablet Take 0.5 tablets (12.5 mg total) by mouth every evening. 01/23/24 04/22/24  Cleatus Arlyss RAMAN, MD  meclizine  (ANTIVERT ) 25 MG tablet Take 1 tablet (25 mg total) by mouth 3 (three) times daily as needed for dizziness or nausea. 04/21/23   Cleatus Arlyss RAMAN, MD   Allergies  Allergen Reactions   Venlafaxine      fatigue    Social History    Tobacco Use   Smoking status: Never   Smokeless tobacco: Never  Substance Use Topics   Alcohol use: Yes    Alcohol/week: 1.0 standard drink of alcohol    Types: 1 Glasses of wine per week    Comment: occ/weekends    Family History  Problem Relation Age of Onset   Cirrhosis Mother        not from etoh per patient report   Throat cancer Father    Breast cancer Neg Hx    Colon cancer Neg Hx      Review of Systems  Positive ROS: neg  All other systems have been reviewed and were otherwise negative with the exception of those mentioned in the HPI and as above.  Objective: Vital signs in last 24 hours: Temp:  [98.5 F (36.9 C)] (P) 98.5 F (36.9 C) (11/14 0959) Pulse Rate:  [91] (P) 91 (11/14 0959) Resp:  [18] (P) 18 (11/14 0959) BP: (P) 141/79 (11/14 0959) SpO2:  [99 %] (P) 99 % (11/14 0959) Weight:  [75.3 kg] (P) 75.3 kg (11/14 0959)  General Appearance: Alert, cooperative, no distress, appears stated age Head: Normocephalic, without obvious abnormality, atraumatic Eyes: PERRL, conjunctiva/corneas clear, EOM's intact    Neck: Supple, symmetrical, trachea midline Back: Symmetric, no curvature, ROM normal, no CVA tenderness Lungs:  respirations unlabored Heart: Regular rate and rhythm Abdomen: Soft, non-tender Extremities: Extremities normal, atraumatic, no cyanosis or edema Pulses: 2+ and symmetric all extremities Skin: Skin color, texture, turgor normal, no rashes or lesions  NEUROLOGIC:   Mental status: Alert and oriented x4,  no aphasia, good attention span, fund of knowledge, and memory Motor Exam - grossly normal Sensory Exam - grossly normal Reflexes: 1+ Coordination - grossly normal Gait - grossly normal Balance - grossly normal Cranial Nerves: I: smell Not tested  II: visual acuity  OS: nl    OD: nl  II: visual fields Full to confrontation  II: pupils Equal, round, reactive to light  III,VII: ptosis None  III,IV,VI: extraocular muscles  Full ROM   V: mastication Normal  V: facial light touch sensation  Normal  V,VII: corneal reflex  Present  VII: facial muscle function - upper  Normal  VII: facial muscle function - lower Normal  VIII: hearing Not tested  IX: soft palate elevation  Normal  IX,X: gag reflex Present  XI: trapezius strength  5/5  XI: sternocleidomastoid strength 5/5  XI: neck flexion strength  5/5  XII: tongue strength  Normal    Data Review Lab Results  Component Value Date   WBC 9.2 03/15/2024   HGB 13.1 03/15/2024   HCT 42.1 03/15/2024   MCV 78.4 (L) 03/15/2024   PLT 268 03/15/2024   Lab Results  Component Value Date   NA 136 03/15/2024   K 3.6 03/15/2024   CL 103 03/15/2024   CO2 23 03/15/2024  BUN 10 03/15/2024   CREATININE 0.81 03/15/2024   GLUCOSE 88 03/15/2024   Lab Results  Component Value Date   INR 0.95 01/01/2018    Assessment/Plan:  Estimated body mass index is 26.79 kg/m (pended) as calculated from the following:   Height as of this encounter: (P) 5' 6 (1.676 m).   Weight as of this encounter: (P) 75.3 kg. Patient admitted for L3 fracture and adjacent level stenosis. Patient has failed a reasonable attempt at conservative therapy.  Plan is for DLL L2-3 L3-4 with instrumented fusion L2-5  I explained the condition and procedure to the patient and answered any questions.  Patient wishes to proceed with procedure as planned. Understands risks/ benefits and typical outcomes of procedure.   Alm GORMAN Molt 03/26/2024 10:48 AM

## 2024-03-26 NOTE — Op Note (Signed)
 03/26/2024  3:32 PM  PATIENT:  Kathy Avila  63 y.o. female  PRE-OPERATIVE DIAGNOSIS:  1.  Lumbar spinal stenosis L2-3 L3-4, 2.  L3 compression fracture, 3.  Previous L4-5 fusion, 4.  Back pain with leg pain  POST-OPERATIVE DIAGNOSIS:  same  PROCEDURE:   1. Decompressive lumbar laminectomy, medial facetectomy and foraminotomies L2-3 and L3-4 bilaterally 2. Posterior fixation L2-L5 inclusive using ATEC cortical pedicle screws.  3. Intertransverse arthrodesis L2-3 L3-4 using morcellized autograft and allograft.  SURGEON:  Alm Molt, MD  ASSISTANTS: Suzen Pean, FNP  ANESTHESIA:  General  EBL: 200 ml  Total I/O In: 1150 [I.V.:1050; IV Piggyback:100] Out: 200 [Blood:200]  BLOOD ADMINISTERED:none  DRAINS: none   INDICATION FOR PROCEDURE: This patient presented with back pain with bilateral leg pain. Imaging revealed an L3 compression fracture with spinal stenosis at L2-3 and L3-4 above previous L4-5 fusion. The patient tried a reasonable attempt at conservative medical measures without relief. I recommended decompression and instrumented fusion to address the stenosis as well as the segmental  instability.  Patient understood the risks, benefits, and alternatives and potential outcomes and wished to proceed.  PROCEDURE DETAILS:  The patient was brought to the operating room. After induction of generalized endotracheal anesthesia the patient was rolled into the prone position on chest rolls and all pressure points were padded. The patient's lumbar region was cleaned and then prepped with DuraPrep and draped in the usual sterile fashion. Anesthesia was injected and then a dorsal midline incision was made and carried down to the lumbosacral fascia. The fascia was opened and the paraspinous musculature was taken down in a subperiosteal fashion to expose L2-3 L3-4 and the previously placed instrumentation at L4-5.  The locking caps were removed from the previous hardware and the  rods were removed.  All 4 screws had excellent purchase.  We left the screws in place.  This was diffuse across the L3 compression fracture.. A self-retaining retractor was placed. Intraoperative fluoroscopy confirmed my level, and I started with placement of the L2 and L3 bilateral cortical pedicle screws. The pedicle screw entry zones were identified utilizing surface landmarks and  AP and lateral fluoroscopy. I scored the cortex with the high-speed drill and then used the hand drill to drill an upward and outward direction into the pedicle. I then tapped line to line. I then placed a 5.5 x 45 mm cortical pedicle screw into the pedicles of L2 and L3 bilaterally.    I then turned my attention to the decompression and complete lumbar laminectomies, medial- facetectomies, and foraminotomies were performed at L2 3 and L3-4.  My nurse practitioner was directly involved in the decompression and exposure of the neural elements. the patient had significant spinal stenosis.  Unfortunately there was severe epidural fibrosis at the top of L4 from the previous decompression and fusion.  There was really no plain over the dura between the ligament and the dura.  An unintended durotomy was created here.  We the decompress the lateral recess and remove the ligament and then repaired the dural tear with a running 6-0 nylon suture.  We then checked a Valsalva 2 times during the case to make sure no leakage from the repair.  The repair looked good.  We continue to decompression performing laminectomies at L2-L3 4 and undercutting the lateral recess until the dura was full and capacious all the way across from the L2 pedicle to the L4 pedicle.  We palpated with a coronary dilator to assure  adequate decompression.   We then decorticated the transverse processes and laid a mixture of morcellized autograft and allograft out over these to perform intertransverse arthrodesis at L2-3 and L3-4 bilaterally. We then placed lordotic rods  into the multiaxial screw heads of the pedicle screws and locked these in position with the locking caps and anti-torque device. We then checked our construct with AP and lateral fluoroscopy. Irrigated with copious amounts of 0.5% povidone iodine solution followed by saline solution. Inspected the nerve roots once again to assure adequate decompression, lined the dura with Tisseel fibrin glue and Gelfoam,  and then we closed the muscle and the fascia with 0 Vicryl. Closed the subcutaneous tissues with 2-0 Vicryl and subcuticular tissues with 3-0 Vicryl. The skin was closed with benzoin and Steri-Strips. Dressing was then applied, the patient was awakened from general anesthesia and transported to the recovery room in stable condition. At the end of the procedure all sponge, needle and instrument counts were correct.   PLAN OF CARE: admit to inpatient  PATIENT DISPOSITION:  PACU - hemodynamically stable.   Delay start of Pharmacological VTE agent (>24hrs) due to surgical blood loss or risk of bleeding:  yes

## 2024-03-26 NOTE — Anesthesia Procedure Notes (Signed)
 Procedure Name: Intubation Date/Time: 03/26/2024 12:10 PM  Performed by: Elby Raelene SAUNDERS, CRNAPre-anesthesia Checklist: Patient identified, Emergency Drugs available, Suction available and Patient being monitored Patient Re-evaluated:Patient Re-evaluated prior to induction Oxygen Delivery Method: Circle System Utilized Preoxygenation: Pre-oxygenation with 100% oxygen Induction Type: IV induction Ventilation: Mask ventilation without difficulty Laryngoscope Size: Glidescope and 4 Grade View: Grade I Tube type: Oral Tube size: 7.0 mm Number of attempts: 1 Airway Equipment and Method: Stylet and Bite block Placement Confirmation: ETT inserted through vocal cords under direct vision, positive ETCO2 and breath sounds checked- equal and bilateral Secured at: 21 cm Tube secured with: Tape Dental Injury: Teeth and Oropharynx as per pre-operative assessment

## 2024-03-26 NOTE — Transfer of Care (Signed)
 Immediate Anesthesia Transfer of Care Note  Patient: Kathy Avila  Procedure(s) Performed: LAMINECTOMY WITH POSTERIOR LATERAL ARTHRODESIS LEVEL 3 (Back)  Patient Location: PACU  Anesthesia Type:General  Level of Consciousness: drowsy  Airway & Oxygen Therapy: Patient Spontanous Breathing and Patient connected to face mask oxygen  Post-op Assessment: Report given to RN and Post -op Vital signs reviewed and stable  Post vital signs: Reviewed and stable  Last Vitals:  Vitals Value Taken Time  BP 148/86 03/26/24 15:40  Temp    Pulse 87 03/26/24 15:42  Resp 10 03/26/24 15:42  SpO2 99 % 03/26/24 15:42  Vitals shown include unfiled device data.  Last Pain:  Vitals:   03/26/24 1020  TempSrc:   PainSc: 9          Complications: No notable events documented.

## 2024-03-26 NOTE — Anesthesia Preprocedure Evaluation (Signed)
 Anesthesia Evaluation  Patient identified by MRN, date of birth, ID band Patient awake    Reviewed: Allergy & Precautions, NPO status , Patient's Chart, lab work & pertinent test results  History of Anesthesia Complications (+) PONV and history of anesthetic complications  Airway Mallampati: II  TM Distance: >3 FB Neck ROM: Full    Dental no notable dental hx. (+) Upper Dentures, Partial Lower   Pulmonary neg pulmonary ROS   Pulmonary exam normal breath sounds clear to auscultation       Cardiovascular hypertension, (-) Past MI Normal cardiovascular exam Rhythm:Regular Rate:Normal  Echocardiogram 01/05/2021:  Normal LV systolic function with visual EF 50-55%. Left ventricle cavity     Neuro/Psych  Headaches PSYCHIATRIC DISORDERS Anxiety Depression       GI/Hepatic ,GERD  Medicated and Controlled,,  Endo/Other    Renal/GU Lab Results      Component                Value               Date                      NA                       136                 03/15/2024                CL                       103                 03/15/2024                K                        3.6                 03/15/2024                CO2                      23                  03/15/2024                BUN                      10                  03/15/2024                CREATININE               0.81                03/15/2024                GFRNONAA                 >60                 03/15/2024                CALCIUM   8.8 (L)             03/15/2024                ALBUMIN                  4.2                 08/14/2023                GLUCOSE                  88                  03/15/2024                Musculoskeletal  (+) Arthritis , Osteoarthritis,    Abdominal   Peds  Hematology Lab Results      Component                Value               Date                      WBC                      9.2                  03/15/2024                HGB                      13.1                03/15/2024                HCT                      42.1                03/15/2024                MCV                      78.4 (L)            03/15/2024                PLT                      268                 03/15/2024              Anesthesia Other Findings   Reproductive/Obstetrics                              Anesthesia Physical Anesthesia Plan  ASA: 3  Anesthesia Plan: General   Post-op Pain Management: Ketamine IV*, Ofirmev  IV (intra-op)* and Dilaudid  IV   Induction: Intravenous  PONV Risk Score and Plan: 4 or greater and Treatment may vary due to age or medical condition, Midazolam  and Ondansetron   Airway Management Planned: Oral ETT and Video Laryngoscope Planned  Additional Equipment: None  Intra-op Plan:   Post-operative Plan: Extubation in OR  Informed Consent: I have reviewed the patients History and Physical, chart, labs and discussed  the procedure including the risks, benefits and alternatives for the proposed anesthesia with the patient or authorized representative who has indicated his/her understanding and acceptance.     Dental advisory given  Plan Discussed with: CRNA and Surgeon  Anesthesia Plan Comments:          Anesthesia Quick Evaluation

## 2024-03-26 NOTE — Anesthesia Postprocedure Evaluation (Signed)
 Anesthesia Post Note  Patient: Kathy Avila  Procedure(s) Performed: LAMINECTOMY WITH POSTERIOR LATERAL ARTHRODESIS LEVEL 3 (Back)     Patient location during evaluation: PACU Anesthesia Type: General Level of consciousness: awake and alert Pain management: pain level controlled Vital Signs Assessment: post-procedure vital signs reviewed and stable Respiratory status: spontaneous breathing, nonlabored ventilation, respiratory function stable and patient connected to nasal cannula oxygen Cardiovascular status: blood pressure returned to baseline and stable Postop Assessment: no apparent nausea or vomiting Anesthetic complications: no   No notable events documented.  Last Vitals:  Vitals:   03/26/24 1630 03/26/24 1645  BP: (!) 130/57 (!) 129/56  Pulse: (!) 103   Resp: 13 11  Temp:    SpO2: 99%     Last Pain:  Vitals:   03/26/24 1600  TempSrc:   PainSc: 10-Worst pain ever    LLE Motor Response: Purposeful movement (03/26/24 1700) LLE Sensation: Full sensation (03/26/24 1700) RLE Motor Response: Purposeful movement (03/26/24 1700) RLE Sensation: Full sensation (03/26/24 1700)      Garnette DELENA Gab

## 2024-03-27 DIAGNOSIS — M48062 Spinal stenosis, lumbar region with neurogenic claudication: Secondary | ICD-10-CM | POA: Diagnosis not present

## 2024-03-27 MED ORDER — METHOCARBAMOL 500 MG PO TABS: 500.0000 mg | ORAL_TABLET | Freq: Three times a day (TID) | ORAL | 1 refills | Status: AC | PRN

## 2024-03-27 MED ORDER — OXYCODONE HCL 5 MG PO TABS: 5.0000 mg | ORAL_TABLET | ORAL | 0 refills | Status: AC | PRN

## 2024-03-27 MED ORDER — ONDANSETRON HCL 4 MG PO TABS: 4.0000 mg | ORAL_TABLET | Freq: Four times a day (QID) | ORAL | 0 refills | Status: AC | PRN

## 2024-03-27 NOTE — Evaluation (Signed)
 Physical Therapy Evaluation Patient Details Name: Kathy Avila MRN: [id number] DOB: 12-01-1960 Today's Date: 03/27/2024  History of Present Illness  Pt is a 63 y.o. female s/p bilateral laminectomy with posterior lateral arthrodesis L2-4. PMH: anxiety, arthritis, B12 deficiency, CAD, depression, GERD, HLD, ACDF.  Clinical Impression  PT eval complete. PTA pt independent, driving, and employed. She lives at home with her husband. On eval, she required CGA bed mobility, CGA transfers, CGA amb 150' with RW, and min assist ascend/descend 2 steps with L rail. All education complete. Plan is for d/c home today. No further skilled PT intervention indicated. Pt would benefit from RW for home. PT signing off.        If plan is discharge home, recommend the following: A little help with walking and/or transfers;A little help with bathing/dressing/bathroom   Can travel by private vehicle        Equipment Recommendations Rolling walker (2 wheels)  Recommendations for Other Services       Functional Status Assessment Patient has had a recent decline in their functional status and demonstrates the ability to make significant improvements in function in a reasonable and predictable amount of time.     Precautions / Restrictions Precautions Precautions: Back Recall of Precautions/Restrictions: Intact Precaution/Restrictions Comments: Educated on 3/3 back precautions. Required Braces or Orthoses: Spinal Brace (has brace, but reports she left it at home) Spinal Brace: Lumbar corset      Mobility  Bed Mobility Overal bed mobility: Needs Assistance Bed Mobility: Rolling, Sidelying to Sit, Sit to Sidelying Rolling: Contact guard assist Sidelying to sit: Contact guard assist, HOB elevated     Sit to sidelying: Contact guard assist, HOB elevated General bed mobility comments: cues for sequencing, increased time    Transfers Overall transfer level: Needs assistance Equipment used:  Rolling walker (2 wheels) Transfers: Sit to/from Stand Sit to Stand: Contact guard assist           General transfer comment: increased time    Ambulation/Gait Ambulation/Gait assistance: Contact guard assist Gait Distance (Feet): 150 Feet Assistive device: Rolling walker (2 wheels) Gait Pattern/deviations: Step-through pattern, Decreased stride length Gait velocity: decreased Gait velocity interpretation: <1.31 ft/sec, indicative of household ambulator   General Gait Details: steady gait with RW  Stairs Stairs: Yes Stairs assistance: Min assist Stair Management: One rail Left, Step to pattern, Forwards Number of Stairs: 2    Wheelchair Mobility     Tilt Bed    Modified Rankin (Stroke Patients Only)       Balance Overall balance assessment: Needs assistance Sitting-balance support: No upper extremity supported, Feet supported Sitting balance-Leahy Scale: Good     Standing balance support: Bilateral upper extremity supported, During functional activity, No upper extremity supported Standing balance-Leahy Scale: Fair Standing balance comment: RW for increased distances                             Pertinent Vitals/Pain Pain Assessment Pain Assessment: Faces Faces Pain Scale: Hurts even more Pain Location: back Pain Descriptors / Indicators: Burning, Grimacing, Guarding, Sore Pain Intervention(s): Monitored during session, Repositioned, Premedicated before session    Home Living Family/patient expects to be discharged to:: Private residence Living Arrangements: Spouse/significant other Available Help at Discharge: Family Type of Home: House Home Access: Stairs to enter Entrance Stairs-Rails: Left Entrance Stairs-Number of Steps: 2   Home Layout: One level Home Equipment: BSC/3in1;Rollator (4 wheels);Cane - single point;Crutches  Prior Function Prior Level of Function : Independent/Modified Independent;Driving;Working/employed              Mobility Comments: no AD ADLs Comments: works at [employer]     Extremity/Trunk Assessment   Upper Extremity Assessment Upper Extremity Assessment: Overall WFL for tasks assessed    Lower Extremity Assessment Lower Extremity Assessment: Generalized weakness    Cervical / Trunk Assessment Cervical / Trunk Assessment: Back Surgery  Communication   Communication Communication: No apparent difficulties    Cognition Arousal: Alert Behavior During Therapy: WFL for tasks assessed/performed   PT - Cognitive impairments: No apparent impairments                         Following commands: Intact       Cueing Cueing Techniques: Verbal cues     General Comments      Exercises     Assessment/Plan    PT Assessment Patient does not need any further PT services  PT Problem List         PT Treatment Interventions      PT Goals (Current goals can be found in the Care Plan section)  Acute Rehab PT Goals Patient Stated Goal: home PT Goal Formulation: All assessment and education complete, DC therapy    Frequency       Co-evaluation               AM-PAC PT 6 Clicks Mobility  Outcome Measure Help needed turning from your back to your side while in a flat bed without using bedrails?: None Help needed moving from lying on your back to sitting on the side of a flat bed without using bedrails?: A Little Help needed moving to and from a bed to a chair (including a wheelchair)?: A Little Help needed standing up from a chair using your arms (e.g., wheelchair or bedside chair)?: A Little Help needed to walk in hospital room?: A Little Help needed climbing 3-5 steps with a railing? : A Little 6 Click Score: 19    End of Session   Activity Tolerance: Patient tolerated treatment well Patient left: in bed;with call bell/phone within reach;with family/visitor present Nurse Communication: Mobility status PT Visit Diagnosis: Other abnormalities of gait  and mobility (R26.89)    Time: 9195-9171 PT Time Calculation (min) (ACUTE ONLY): 24 min   Charges:   PT Evaluation $PT Eval Moderate Complexity: 1 Mod   PT General Charges $$ ACUTE PT VISIT: 1 Visit         Sari MATSU., PT  Office # [phone]   Erven Sari Shaker 03/27/2024, 10:28 AM

## 2024-03-27 NOTE — Progress Notes (Signed)
 Patient alert and oriented, voided, ambulate. Surgical site clean and dry no sign of infection. D/c instructions explain and given to the patient qll questions answered. Patient d/c home with RW per order.

## 2024-03-27 NOTE — Evaluation (Signed)
 Occupational Therapy Evaluation Patient Details Name: Kathy Avila MRN: [id number] DOB: 05-Jun-1960 Today's Date: 03/27/2024   History of Present Illness   Pt is a 63 y.o. female s/p bilateral laminectomy with posterior lateral arthrodesis L2-4. PMH: anxiety, arthritis, B12 deficiency, CAD, depression, GERD, HLD, ACDF.     Clinical Impressions PTA, pt lived with her spouse and was independent, working at [employer]. Upon eval, pt performing UB ADL with set-up and LB ADL with up to min A due to inability to perform figure 4 for donning L sock. Pt educated and demonstrating use of compensatory techniques for bed mobility, LB ADL, grooming, toileting, and shower transfers within precautions. All education provided and questions answered. Recommending discharge home with no OT follow up at this time. Husband and friend able to assist pt as needed with LB ADL.        If plan is discharge home, recommend the following:   A little help with walking and/or transfers;A little help with bathing/dressing/bathroom;Assistance with cooking/housework;Assist for transportation;Help with stairs or ramp for entrance     Functional Status Assessment   Patient has had a recent decline in their functional status and demonstrates the ability to make significant improvements in function in a reasonable and predictable amount of time.     Equipment Recommendations   Other (comment) (RW)     Recommendations for Other Services         Precautions/Restrictions   Precautions Precautions: Back Precaution Booklet Issued: Yes (comment) Recall of Precautions/Restrictions: Intact Required Braces or Orthoses: Spinal Brace (has brace, but reports she left it at home) Restrictions Weight Bearing Restrictions Per Provider Order: No     Mobility Bed Mobility Overal bed mobility: Needs Assistance Bed Mobility: Rolling, Sidelying to Sit Rolling: Contact guard assist Sidelying to sit: Contact  guard assist       General bed mobility comments: cues for optimal technique    Transfers Overall transfer level: Needs assistance Equipment used: Rolling walker (2 wheels) Transfers: Sit to/from Stand Sit to Stand: Contact guard assist           General transfer comment: CGA for safety, slow to rise, but no hands on assist needed      Balance Overall balance assessment: Needs assistance Sitting-balance support: No upper extremity supported, Feet supported Sitting balance-Leahy Scale: Good     Standing balance support: Bilateral upper extremity supported, During functional activity, No upper extremity supported   Standing balance comment: benefits from UE support dynamically                           ADL either performed or assessed with clinical judgement   ADL Overall ADL's : Needs assistance/impaired Eating/Feeding: Sitting;Independent   Grooming: Supervision/safety;Standing;Oral care   Upper Body Bathing: Set up;Sitting   Lower Body Bathing: Minimal assistance;Sit to/from stand   Upper Body Dressing : Set up;Sitting   Lower Body Dressing: Minimal assistance;Sit to/from stand Lower Body Dressing Details (indicate cue type and reason): for LLE Toilet Transfer: Contact guard assist;Ambulation;Rolling walker (2 wheels)     Toileting - Clothing Manipulation Details (indicate cue type and reason): reviewed compensatory techniques Tub/ Shower Transfer: Contact guard assist;Minimal assistance;BSC/3in1;Tub transfer   Functional mobility during ADLs: Contact guard assist;Rolling walker (2 wheels)       Vision         Perception         Praxis         Pertinent  Vitals/Pain Pain Assessment Pain Assessment: Faces Faces Pain Scale: Hurts even more Pain Location: operative site, LLE Pain Descriptors / Indicators: Operative site guarding Pain Intervention(s): Limited activity within patient's tolerance, Monitored during session      Extremity/Trunk Assessment Upper Extremity Assessment Upper Extremity Assessment: Overall WFL for tasks assessed   Lower Extremity Assessment Lower Extremity Assessment: Defer to PT evaluation       Communication Communication Communication: No apparent difficulties   Cognition Arousal: Alert Behavior During Therapy: WFL for tasks assessed/performed Cognition: No apparent impairments                               Following commands: Intact       Cueing  General Comments   Cueing Techniques: Verbal cues      Exercises     Shoulder Instructions      Home Living Family/patient expects to be discharged to:: Private residence Living Arrangements: Spouse/significant other (Phil) Available Help at Discharge: Family Type of Home: House Home Access: Stairs to enter [employer] of Steps: 2 (into back door) Entrance Stairs-Rails: Left Home Layout: One level     Bathroom Shower/Tub: Chief Strategy Officer: Handicapped height     Home Equipment: BSC/3in1;Rollator (4 wheels);Cane - single point;Crutches          Prior Functioning/Environment Prior Level of Function : Independent/Modified Independent;Driving;Working/employed             Mobility Comments: was not using DME PTA ADLs Comments: works at [employer]    OT Problem List: Decreased strength;Impaired balance (sitting and/or standing);Decreased activity tolerance;Decreased knowledge of use of DME or AE;Decreased knowledge of precautions;Pain   OT Treatment/Interventions: Self-care/ADL training;Therapeutic exercise;DME and/or AE instruction;Therapeutic activities;Patient/family education;Balance training      OT Goals(Current goals can be found in the care plan section)   Acute Rehab OT Goals Patient Stated Goal: get better OT Goal Formulation: With patient Time For Goal Achievement: 04/10/24 Potential to Achieve Goals: Good   OT Frequency:  Min 1X/week     Co-evaluation              AM-PAC OT 6 Clicks Daily Activity     Outcome Measure Help from another person eating meals?: None Help from another person taking care of personal grooming?: A Little Help from another person toileting, which includes using toliet, bedpan, or urinal?: A Little Help from another person bathing (including washing, rinsing, drying)?: A Little Help from another person to put on and taking off regular upper body clothing?: A Little Help from another person to put on and taking off regular lower body clothing?: A Little 6 Click Score: 19   End of Session Equipment Utilized During Treatment: Rolling walker (2 wheels) Nurse Communication: Mobility status  Activity Tolerance: Patient tolerated treatment well Patient left: Other (comment) (in hall with PT)  OT Visit Diagnosis: Unsteadiness on feet (R26.81);Muscle weakness (generalized) (M62.81);Pain Pain - Right/Left: Left Pain - part of body: Knee (back)                Time: 9253-9183 OT Time Calculation (min): 30 min Charges:  OT General Charges $OT Visit: 1 Visit OT Evaluation $OT Eval Low Complexity: 1 Low OT Treatments $Self Care/Home Management : 8-22 mins  Elma JONETTA Lebron FREDERICK, OTR/L [facility] Acute Rehabilitation Office: [phone]   Elma JONETTA Lebron 03/27/2024, 8:48 AM

## 2024-03-27 NOTE — Discharge Summary (Signed)
 Physician Discharge Summary  Patient ID: Kathy Avila MRN: [id number] DOB/AGE: [birth year] 63 y.o.  Admit date: 03/26/2024 Discharge date: 03/27/2024  Admission Diagnoses: L3 fracture, lumbar spinal stenosis    Discharge Diagnoses: Same   Discharged Condition: good  Hospital Course: The patient was admitted on 03/26/2024 and taken to the operating room where the patient underwent decompression and instrumented fusion L2-L5. The patient tolerated the procedure well and was taken to the recovery room and then to the floor in stable condition. The hospital course was routine. There were no complications. The wound remained clean dry and intact. Pt had appropriate back soreness. No complaints of leg pain or new N/T/W. The patient remained afebrile with stable vital signs, and tolerated a regular diet. The patient continued to increase activities, and pain was well controlled with oral pain medications.   Consults: None  Significant Diagnostic Studies:  Results for orders placed or performed during the hospital encounter of 03/15/24  Surgical pcr screen   Collection Time: 03/15/24  2:12 PM   Specimen: Nasal Mucosa; Nasal Swab  Result Value Ref Range   MRSA, PCR NEGATIVE NEGATIVE   Staphylococcus aureus POSITIVE (A) NEGATIVE  Basic metabolic panel per protocol   Collection Time: 03/15/24  2:12 PM  Result Value Ref Range   Sodium 136 135 - 145 mmol/L   Potassium 3.6 3.5 - 5.1 mmol/L   Chloride 103 98 - 111 mmol/L   CO2 23 22 - 32 mmol/L   Glucose, Bld 88 70 - 99 mg/dL   BUN 10 8 - 23 mg/dL   Creatinine, Ser 9.18 0.44 - 1.00 mg/dL   Calcium  8.8 (L) 8.9 - 10.3 mg/dL   GFR, Estimated >39 >39 mL/min   Anion gap 10 5 - 15  CBC per protocol   Collection Time: 03/15/24  2:12 PM  Result Value Ref Range   WBC 9.2 4.0 - 10.5 K/uL   RBC 5.37 (H) 3.87 - 5.11 MIL/uL   Hemoglobin 13.1 12.0 - 15.0 g/dL   HCT 57.8 63.9 - 53.9 %   MCV 78.4 (L) 80.0 - 100.0 fL   MCH 24.4 (L) 26.0 - 34.0  pg   MCHC 31.1 30.0 - 36.0 g/dL   RDW 86.7 88.4 - 84.4 %   Platelets 268 150 - 400 K/uL   nRBC 0.0 0.0 - 0.2 %  Type and screen [facility]   Collection Time: 03/15/24  3:00 PM  Result Value Ref Range   ABO/RH(D) O POS    Antibody Screen NEG    Sample Expiration 03/29/2024,2359    Extend sample reason      NO TRANSFUSIONS OR PREGNANCY IN THE PAST 3 MONTHS Performed at [facility], [address] [place], [place] [address]     DG Lumbar Spine 2-3 Views Result Date: 03/26/2024 EXAM: XR Intraoperative Imaging C arm, 2 or 3 Views TECHNIQUE: Fluoroscopy was provided by the radiology department for procedure. Radiologist was not present during examination. FLUOROSCOPY DOSE AND TYPE: Radiation Dose Index: Reference Air Kerma (in mGy) = 23.56 COMPARISON: Lumbar spine radiograph 11/25/2023. CLINICAL HISTORY: 461500 Elective surgery 461500 Elective surgery 461500 FINDINGS: Intraoperative fluoroscopic imaging was performed. Pedicle screw and rod fixation was extended superiorly to now include L2-L3 and L3-L4. L3 lamina noted. IMPRESSION: 1. Pedicle screw and rod fixation extended superiorly to now include L2-3 and L3-4. NOTE: Intraoperative fluoroscopic spot images as above. Please refer to the intraoperative report for full details. Electronically signed by: Lonni Necessary  MD 03/26/2024 07:24 PM EST RP Workstation: [id number]   DG C-Arm 1-60 Min-No Report Result Date: 03/26/2024 Fluoroscopy was utilized by the requesting physician.  No radiographic interpretation.   DG C-Arm 1-60 Min-No Report Result Date: 03/26/2024 Fluoroscopy was utilized by the requesting physician.  No radiographic interpretation.   DG C-Arm 1-60 Min-No Report Result Date: 03/26/2024 Fluoroscopy was utilized by the requesting physician.  No radiographic interpretation.    Antibiotics:  Anti-infectives (From admission, onward)    Start     Dose/Rate Route Frequency Ordered Stop   03/26/24  2000  ceFAZolin  (ANCEF ) IVPB 2g/100 mL premix        2 g 200 mL/hr over 30 Minutes Intravenous Every 8 hours 03/26/24 1724 03/27/24 0823   03/26/24 1015  ceFAZolin  (ANCEF ) IVPB 2g/100 mL premix        2 g 200 mL/hr over 30 Minutes Intravenous On call to O.R. 03/26/24 1012 03/26/24 1245   03/26/24 1015  ceFAZolin  (ANCEF ) 2-4 GM/100ML-% IVPB       Note to Pharmacy: Gleason, Ginger E: cabinet override      03/26/24 1015 03/26/24 1235       Discharge Exam: Blood pressure (!) 124/55, pulse 64, temperature 97.8 F (36.6 C), temperature source Oral, resp. rate 19, height (P) 5' 6 (1.676 m), weight (P) 75.3 kg, SpO2 91%. Neurologic: Grossly normal Dressing dry  Discharge Medications:   Allergies as of 03/27/2024       Reactions   Venlafaxine     fatigue        Medication List     TAKE these medications    acetaminophen  500 MG tablet Commonly known as: TYLENOL  Take 1,000 mg by mouth every 6 (six) hours as needed for moderate pain (pain score 4-6).   atorvastatin  10 MG tablet Commonly known as: LIPITOR Take 1 tablet (10 mg total) by mouth at bedtime.   buPROPion  300 MG 24 hr tablet Commonly known as: WELLBUTRIN  XL Take 1 tablet (300 mg total) by mouth daily.   chlorhexidine  4 % external liquid Commonly known as: HIBICLENS  Apply 15 mLs (1 Application total) topically as directed for 30 doses. Use as directed daily for 5 days every other week for 6 weeks.   cholecalciferol  25 MCG (1000 UNIT) tablet Commonly known as: VITAMIN D3 Take 1,000 Units by mouth daily.   cyanocobalamin 500 MCG tablet Commonly known as: VITAMIN B12 Take 500 mcg by mouth every Monday, Wednesday, and Friday.   estradiol  14 MCG/24HR Commonly known as: MENOSTAR  Place 1 patch onto the skin 2 (two) times a week.   losartan  25 MG tablet Commonly known as: COZAAR  Take 0.5 tablets (12.5 mg total) by mouth every evening.   meclizine  25 MG tablet Commonly known as: ANTIVERT  Take 1 tablet (25 mg  total) by mouth 3 (three) times daily as needed for dizziness or nausea.   meloxicam  7.5 MG tablet Commonly known as: MOBIC  Take 1 tablet (7.5 mg total) by mouth 2 (two) times daily. With food. What changed:  when to take this reasons to take this additional instructions   methocarbamol  500 MG tablet Commonly known as: ROBAXIN  Take 1 tablet (500 mg total) by mouth every 8 (eight) hours as needed.   multivitamin with minerals Tabs tablet Take 1 tablet by mouth daily.   mupirocin ointment 2 % Commonly known as: BACTROBAN Place 1 Application into the nose 2 (two) times daily for 60 doses. Use as directed 2 times daily for 5 days every other week for  6 weeks.   omeprazole  40 MG capsule Commonly known as: PRILOSEC Take 1 capsule (40 mg total) by mouth daily.   ondansetron  4 MG tablet Commonly known as: ZOFRAN  Take 1 tablet (4 mg total) by mouth every 6 (six) hours as needed for nausea or vomiting.   oxyCODONE  5 MG immediate release tablet Commonly known as: Oxy IR/ROXICODONE  Take 1 tablet (5 mg total) by mouth every 4 (four) hours as needed for moderate pain (pain score 4-6).   propranolol  20 MG tablet Commonly known as: INDERAL  Take 1 tablet (20 mg total) by mouth 2 (two) times daily. What changed: when to take this   traMADol  50 MG tablet Commonly known as: Ultram  Take 0.5-1 tablets (25-50 mg total) by mouth every 6 (six) hours as needed for moderate pain (pain score 4-6). What changed: when to take this   vitamin E 180 MG (400 UNITS) capsule Take 400 Units by mouth daily.               Durable Medical Equipment  (From admission, onward)           Start     Ordered   03/26/24 1725  DME Walker rolling  Once       Question:  Patient needs a walker to treat with the following condition  Answer:  S/P lumbar fusion   03/26/24 1724   03/26/24 1725  DME 3 n 1  Once        03/26/24 1724              Discharge Care Instructions  (From admission, onward)            Start     Ordered   03/27/24 0000  Discharge wound care:       Comments: Remove dressing in 5-7 days   03/27/24 9173            Disposition: home   Final Dx: Decompression and instrumented fusion L2-L5  Discharge Instructions     Call MD for:  difficulty breathing, headache or visual disturbances   Complete by: As directed    Call MD for:  persistant nausea and vomiting   Complete by: As directed    Call MD for:  redness, tenderness, or signs of infection (pain, swelling, redness, odor or green/yellow discharge around incision site)   Complete by: As directed    Call MD for:  severe uncontrolled pain   Complete by: As directed    Call MD for:  temperature >100.4   Complete by: As directed    Diet - low sodium heart healthy   Complete by: As directed    Discharge wound care:   Complete by: As directed    Remove dressing in 5-7 days   Increase activity slowly   Complete by: As directed           Signed: Alm GORMAN Molt 03/27/2024, 8:26 AM

## 2024-03-27 NOTE — Care Management (Signed)
 Patient with order to DC to home today. Unit staff to provide DME needed for home.   No HH needs identified Patient will have family/ friends provide transportation home. No other TOC needs identified for DC

## 2024-03-29 ENCOUNTER — Encounter (HOSPITAL_COMMUNITY): Payer: Self-pay | Admitting: Neurological Surgery
